# Patient Record
Sex: Male | Born: 1940 | Race: White | Hispanic: No | Marital: Married | Smoking: Former smoker
Health system: Southern US, Community
[De-identification: ages and names within clinical notes are randomized; demographics above are authoritative.]

## PROBLEM LIST (undated history)

## (undated) DIAGNOSIS — C44300 Unspecified malignant neoplasm of skin of unspecified part of face: Secondary | ICD-10-CM

## (undated) DIAGNOSIS — N390 Urinary tract infection, site not specified: Secondary | ICD-10-CM

## (undated) DIAGNOSIS — K219 Gastro-esophageal reflux disease without esophagitis: Secondary | ICD-10-CM

## (undated) DIAGNOSIS — F419 Anxiety disorder, unspecified: Secondary | ICD-10-CM

## (undated) DIAGNOSIS — E785 Hyperlipidemia, unspecified: Secondary | ICD-10-CM

## (undated) DIAGNOSIS — I219 Acute myocardial infarction, unspecified: Secondary | ICD-10-CM

## (undated) DIAGNOSIS — Z8489 Family history of other specified conditions: Secondary | ICD-10-CM

## (undated) DIAGNOSIS — I10 Essential (primary) hypertension: Secondary | ICD-10-CM

## (undated) DIAGNOSIS — M1990 Unspecified osteoarthritis, unspecified site: Secondary | ICD-10-CM

## (undated) DIAGNOSIS — I2510 Atherosclerotic heart disease of native coronary artery without angina pectoris: Secondary | ICD-10-CM

## (undated) HISTORY — DX: Unspecified malignant neoplasm of skin of unspecified part of face: C44.300

## (undated) HISTORY — PX: CARDIAC CATHETERIZATION: SHX172

## (undated) HISTORY — DX: Urinary tract infection, site not specified: N39.0

## (undated) HISTORY — PX: COLONOSCOPY: SHX174

## (undated) HISTORY — PX: OTHER SURGICAL HISTORY: SHX169

## (undated) HISTORY — PX: CORONARY ANGIOPLASTY: SHX604

## (undated) HISTORY — DX: Hyperlipidemia, unspecified: E78.5

---

## 2001-03-18 DIAGNOSIS — D126 Benign neoplasm of colon, unspecified: Secondary | ICD-10-CM | POA: Insufficient documentation

## 2003-12-10 ENCOUNTER — Ambulatory Visit: Payer: Self-pay | Admitting: Family Medicine

## 2004-03-05 ENCOUNTER — Ambulatory Visit: Payer: Self-pay | Admitting: Family Medicine

## 2004-03-17 ENCOUNTER — Ambulatory Visit: Payer: Self-pay | Admitting: Family Medicine

## 2004-04-28 ENCOUNTER — Ambulatory Visit: Payer: Self-pay | Admitting: Family Medicine

## 2004-06-16 ENCOUNTER — Ambulatory Visit: Payer: Self-pay | Admitting: Family Medicine

## 2004-08-22 ENCOUNTER — Ambulatory Visit: Payer: Self-pay | Admitting: Family Medicine

## 2004-10-27 ENCOUNTER — Ambulatory Visit: Payer: Self-pay | Admitting: Family Medicine

## 2005-05-06 ENCOUNTER — Ambulatory Visit: Payer: Self-pay | Admitting: Family Medicine

## 2005-05-18 ENCOUNTER — Ambulatory Visit: Payer: Self-pay | Admitting: Family Medicine

## 2006-03-07 ENCOUNTER — Inpatient Hospital Stay (HOSPITAL_COMMUNITY): Admission: EM | Admit: 2006-03-07 | Discharge: 2006-03-11 | Payer: Self-pay | Admitting: Emergency Medicine

## 2006-03-08 ENCOUNTER — Ambulatory Visit: Payer: Self-pay | Admitting: Vascular Surgery

## 2006-05-26 ENCOUNTER — Ambulatory Visit: Payer: Self-pay | Admitting: Family Medicine

## 2006-06-18 ENCOUNTER — Ambulatory Visit: Payer: Self-pay | Admitting: Internal Medicine

## 2006-06-24 ENCOUNTER — Ambulatory Visit: Payer: Self-pay | Admitting: Internal Medicine

## 2006-06-24 DIAGNOSIS — K648 Other hemorrhoids: Secondary | ICD-10-CM | POA: Insufficient documentation

## 2007-02-21 ENCOUNTER — Ambulatory Visit (HOSPITAL_COMMUNITY): Admission: RE | Admit: 2007-02-21 | Discharge: 2007-02-21 | Payer: Self-pay | Admitting: Family Medicine

## 2007-05-19 DIAGNOSIS — I2510 Atherosclerotic heart disease of native coronary artery without angina pectoris: Secondary | ICD-10-CM | POA: Insufficient documentation

## 2007-05-19 DIAGNOSIS — K5730 Diverticulosis of large intestine without perforation or abscess without bleeding: Secondary | ICD-10-CM | POA: Insufficient documentation

## 2007-05-19 DIAGNOSIS — F329 Major depressive disorder, single episode, unspecified: Secondary | ICD-10-CM

## 2007-05-19 DIAGNOSIS — E785 Hyperlipidemia, unspecified: Secondary | ICD-10-CM

## 2007-05-19 DIAGNOSIS — I219 Acute myocardial infarction, unspecified: Secondary | ICD-10-CM | POA: Insufficient documentation

## 2007-05-19 DIAGNOSIS — F3289 Other specified depressive episodes: Secondary | ICD-10-CM | POA: Insufficient documentation

## 2007-05-31 ENCOUNTER — Encounter: Payer: Self-pay | Admitting: Internal Medicine

## 2007-07-06 ENCOUNTER — Ambulatory Visit: Payer: Self-pay | Admitting: Internal Medicine

## 2007-07-06 DIAGNOSIS — L290 Pruritus ani: Secondary | ICD-10-CM | POA: Insufficient documentation

## 2007-07-06 DIAGNOSIS — R195 Other fecal abnormalities: Secondary | ICD-10-CM | POA: Insufficient documentation

## 2007-07-06 LAB — CONVERTED CEMR LAB
Basophils Relative: 1.3 % — ABNORMAL HIGH (ref 0.0–1.0)
Eosinophils Relative: 1.8 % (ref 0.0–5.0)
HCT: 40.8 % (ref 39.0–52.0)
MCV: 91.9 fL (ref 78.0–100.0)
Monocytes Relative: 11.3 % (ref 3.0–12.0)

## 2010-05-20 NOTE — Assessment & Plan Note (Signed)
[facility]                         GASTROENTEROLOGY OFFICE NOTE   NAME:Bradley Kaufman, Bradley Kaufman                        MRN:          [id number]  DATE:06/18/2006                            DOB:          1940-05-03    REFERRING PHYSICIAN:  Delaney Meigs, M.D.   REASON FOR CONSULTATION:  History of polyps.   ASSESSMENT:  A 70 year old white man who had a 3 mm polyp removed but  not recovered from colonoscopy 5 years ago (Dr. Corinda Gubler).  He also has  had some rectal bleeding lately, blood on the tissue paper.  Also, had a  CYPHER stent placed into the right coronary artery by Dr. Nanetta Batty  in March of 2008.   PLAN:  It is reasonable to perform a colonoscopy for the history of  polyps, though we do not know the exact pathology, but he has also had  rectal bleeding and it has been 5 years since the last colonoscopy.  He  will not be able to come off his Plavix for a year for elective issues.  We will perform the procedure on Plavix and his aspirin.  Risks,  benefits, indications and limitations placed upon the procedure due to  Plavix use (could not perform hot snare) are reviewed with the patient.  He understands and agrees to proceed.   HISTORY:  See my medical history form.   PAST MEDICAL HISTORY:  1. Coronary artery disease with CYPHER stenting to the right coronary      artery and myocardial infarction, March 2008.  2. Dyslipidemia.  3. Former smoker.  4. Depression.  5. Colonoscopy March 18, 2001 showing a 3 mm sessile polyp that was      hot-biopsy removed, but not retrieved, and diverticulosis in the      left colon.   MEDICATIONS:  1. Lipitor 40 mg daily.  2. Zoloft 50 mg daily.  3. Plavix 75 mg daily.  4. Lopressor 1 b.i.d.  5. Fish oil daily.  6. Aspirin 325 mg daily.  7. Multivitamin daily.   DRUG ALLERGIES:  None known.   FAMILY HISTORY:  Father had heart disease.  No colon cancer.   SOCIAL HISTORY:  He is married.  He is a  Press photographer.  He is approaching retirement, he says.  One son, 1  daughter.  Occasional alcohol.  No tobacco or drugs.   REVIEW OF SYSTEMS:  See medical history form.  He has been somewhat  tired.  Had some insomnia.  All other systems are negative.   PHYSICAL EXAM:  Reveals a well-developed, well-nourished white man.  Height 5 feet 7 inches, weight 183.6 pounds, blood pressure 96/68, pulse  78.  EYES:  Anicteric.  MOUTH:  Free of lesions.  NECK:  Supple.  No thyromegaly or mass.  CHEST:  Clear.  HEART:  S1, S2.  No murmurs, rubs, or gallops.  ABDOMEN:  Soft and nontender without organomegaly or mass.  RECTAL:  Deferred.  LYMPHATICS:  No neck or supraclavicular nodes.  EXTREMITIES:  No peripheral edema.  He is alert and oriented x3.  SKIN:  He has a healing ulcer on the nose from recent excision of a  precancerous skin lesion.  He has a Band-Aid on the back from same.   I appreciate the opportunity to care for this patient.     Iva Boop, MD,FACG  Electronically Signed    CEG/MedQ  DD: 06/18/2006  DT: 06/18/2006  Job #: [id number]   cc:   Delaney Meigs, M.D.  Nanetta Batty, M.D.

## 2010-05-23 NOTE — Cardiovascular Report (Signed)
[name], [name] NO.:  [id number]   MEDICAL RECORD NO.:  [id number]          PATIENT TYPE:  INP   LOCATION:  2901                         FACILITY:  [facility]   PHYSICIAN:  Bradley Kaufman, M.D.     DATE OF BIRTH:  [birth year]   DATE OF PROCEDURE:  DATE OF DISCHARGE:                            CARDIAC CATHETERIZATION   INDICATIONS:  Bradley Kaufman is a 70 year old patient who has  previously undergone PTCA in 1991 (questionable vessel).  He had been  doing fairly well.  He has a history of tobacco use and quit smoking 1  month ago.  For the past week, he started to notice developments of left  jaw, chest discomfort to his shoulder and down his arm.  Today, at  approximately 3:30 p.m., he developed severe episode of chest pain, jaw  and arm pain.  Approximately 2 hours later, he presented to [facility] Emergency Room where ECG was compatible with inferior ST-  segment elevation myocardial infarction with 3-4 mm ST elevation.  He  was treated with IV Lopressor, heparin, morphine, IV nitroglycerin and  aspirin.  Code STEMI was called.  Pain started to improve somewhat with  lessening of ST-segment depression after the second 5 mg IV Lopressor  dose.  However, the pain recurred.  He received an additional 5 mg IV  Lopressor and Integrilin was begun prior to transfer to the cardiac  catheterization laboratory.   PROCEDURE:  He arrived in the cardiac catheterization laboratory at 1813  from the emergency room.  He was still having chest pain.  Right femoral  artery and femoral vein were punctured anteriorly and a 6-French  arterial sheath and venous sheath were inserted.  Diagnostic  catheterization was done utilizing 6-French diagnostic coronary  catheters as well as 6-French pigtail catheter which was used for  biplane selective artery as well as distal aortography.  With  demonstration of total occlusion of the RCA, plans were to proceed with  emergent  percutaneous coronary intervention.  The PTCA procedure was  somewhat difficult secondary to catheter backup and crossing the lesion.  Initially, a FR-4 guide with side holes was used but backup support was  poor.  An Asahi medium wire was advanced beyond the subtotal proximal  stenosis but did not extend around the bend.  A 2.0 x 15-mm Maverick  balloon was then inserted to aid with wire navigation.  Initial balloon  dilatation was done at approximately 1838.  The wire was never able to  go beyond the total occlusion significantly and be navigated.  Consequently, the entire system was exchanged and a hockey-stick with  side hole guide was inserted for more optimal backup support.  The Asahi  medium was still unable to cross beyond the total occlusion  significantly and this was removed and exchanged for a Luge wire.  The  Luge wire was unsuccessful.  Ultimately, a choice PT wire was inserted  and with balloon support this ultimately was able to cross completely  the total occlusion and extend down to the distal RCA.  Several  dilatations  were made at the proximal site and also at the site  proximally beyond the initial bend.  It became apparent that there was  an additional lesion of approximately 70-80% just beyond the acute  margin.  Additional dilatation was done here with this 2.0 x 15-mm  Maverick balloon.  Ultimately, a 3.0 x 80-mm drug-eluting Cypher stent  was then inserted beyond the crux and dilated to 14 atmospheres x2.  A  3.0 x 23-mm Cypher stent was inserted proximally to cover the diffuse  high-grade stenoses proximally with the same extending proximal to the  99% stenosis and beyond the site of previous total occlusion.  This was  dilated to 15 atmospheres x2.  A 3.25-mm Quantum balloon was used for  poststent dilatation at both stents with dilatation up to 3.21 mm in the  more distally placed stent and a 3.25 mm in the more proximally placed  stent.  The patient's chest  pain vanished during the procedure with re-  establishment of flow.  During the procedure, he received additional IV  fentanyl at the beginning of the procedure for his ongoing chest pain in  addition to Valium.  He had previously received 10 mg of morphine.  He  received several doses of intracoronary nitroglycerin.  He did receive  double bolus Integrilin.  ACT was documented to be therapeutic.  He also  was enrolled in the [place] study and received either IV obtained  Cangrelor or Plavix at the start of the procedure per Alla Feeling protocol  with plans for continuation of Plavix per Alla Feeling protocol orally.   The patient left the catheterization laboratory in stable condition.   HEMODYNAMIC DATA:  Initial central aortic pressure was 164/108.  However, during the procedure, the patient did have reperfusion  arrhythmia with idioventricular rhythm.  Left ventricular pressure was  131/25.  However, on pullback, there was no gradient and left  ventricular pressure was 85/20.  Central aortic pressure was 85/52.   ANGIOGRAPHIC DATA:  There was evidence for mild coronary calcification  of the LAD system.   The left main coronary artery was angiographically normal.  Bifurcated  into an LAD and left circumflex system.   The LAD proximally had 30% narrowing before several septal perforating  arteries.  The LAD in its proximal third to mid segment seemed to have  narrowing of 70-80%, between the third and fourth septal perforating  artery before diagonal vessel.  The remainder of the LAD was  angiographically normal and extended to the apex.   The circumflex vessel gave rise to a proximal marginal vessel that had  segmental 30% narrowings.  The mid AV groove circumflex had diffuse 60%  narrowings.   The right coronary artery had an upward takeoff and immediately gave  rise to a conus branch.  Beyond the conus branch, the vessel was 99% stenosed and there was TIMI I flow around the  proximal bend and the  right coronary was totally occluded 100% with TIMI zero flow.   Biplane cine left ventriculography revealed mild acute LV dysfunction  with mid to basal, moderate hypocontractility on the RAO projection and  on the LAO projection hypocontractility was severe in the distal  inferoapical to low posterolateral wall.   Distal aortography did not demonstrate any significant renal artery  stenosis or significant aortoiliac disease.   Following difficult but successful coronary intervention to the right  coronary artery, the right coronary artery proved to be a moderate-size  vessel.  The diffuse proximal 99%  stenosis proximal to the bend and then  100% occlusion beyond the bend was ultimately stented with a 3.0 x 23-mm  Cypher stent and postdilated to 3.25 mm and this was reduced from  99/100% with diffuse irregularity in between the high-grade lesions to  0%.  There was mild 30% narrowing proximal to the crux which was not  intervened upon.  There was 70% diffuse stenosis beyond the crux which  ultimately underwent PTCA/stenting with a 3.0 x 18 mm Cypher stent  postdilated to 3.21 mm.  The distal RCA was a moderate-size vessel that  gave rise to a PDA and posterolateral system.   IMPRESSION:  1. Acute ST-segment elevation inferior wall myocardial infarction      secondary to total occlusion of the right coronary artery.  2. Mild to moderate left ventricular acute dysfunction with moderate      hypocontractility in the mid to basal posterior wall and severe      hypocontractility in the inferoapical to low posterolateral wall.  3. Multivessel coronary artery disease with coronary calcification      involving the left anterior descending artery with 20-30% proximal      left anterior descending artery narrowing, 70-80% mid-left anterior      descending artery narrowing; 30% obtuse marginal  #1 stenosis of      the circumflex vessel with diffuse long mid 60%  arteriovenous      groove left circumflex stenosis; and 99% and near ostial proximal      right coronary artery stenosis followed by total occlusion beyond      the proximal bend of the right coronary artery with TIMI zero flow.  4. Successful percutaneous transluminal coronary angioplasty/stenting      of the right coronary artery with the 99% and 100% occlusions being      reduced to 0% with ultimate insertion of a 3.25 x 23-mm Cypher      stent postdilated to 3.25 mm, and percutaneous transluminal      coronary angioplasty/stenting of the mid right coronary artery      stenosis of 70-80% being reduced to 0% with a 3.0 x 18-mm Cypher      stent postdilated to 3.21 mm.  5. Chest pain onset approximately 3:30:  The patient arrival in the      emergency room approximately 5:30, the patient arrival to the      cardiac catheterization laboratory approximately 6:13 p.m. and      reperfusion time approximately 1838 giving to the emergency room     arrival to balloon time of approximately 68 minutes.           ______________________________  Bradley Kaufman, M.D.     TK/MEDQ  D:  03/07/2006  T:  03/08/2006  Job:  [id number]   cc:   Delaney Meigs, M.D.  Nanetta Batty, M.D.

## 2010-05-23 NOTE — H&P (Signed)
NAMEBERLE, FITZ NO.:  [id number]   MEDICAL RECORD NO.:  [id number]          PATIENT TYPE:  INP   LOCATION:  3703                         FACILITY:  [facility]   PHYSICIAN:  Nanetta Batty, M.D.   DATE OF BIRTH:  [birth year]   DATE OF ADMISSION:  03/07/2006  DATE OF DISCHARGE:                              HISTORY & PHYSICAL   CHIEF COMPLAINT:  Chest pain and jaw pain.   HISTORY OF PRESENT ILLNESS:  Mr. Baack is a 70 year old male followed  by Dr. Allyson Sabal and Dr. Joette Catching.  He has a history of coronary  disease.  He had a remote RCA intervention in the early 1990s.  He is  admitted tonight to the emergency room with substernal chest pain and  jaw pain which started at about 3:30 p.m..  In the emergency room he had  inferior ST elevation, which improved after Lopressor, aspirin.  Integrilin and nitroglycerin.  He was taken urgently to the  catheterization lab.  He does admit to symptoms off and on for about a  week now.  Symptoms suddenly became worse today.   PAST MEDICAL HISTORY:  Remarkable for dyslipidemia.  He is on Lipitor.  He has had a prior ankle surgery.  He had a Cardiolite study that was  low risk in 2006.   CURRENT MEDICATIONS:  1. Lipitor 40 mg a day.  2. Aspirin daily.  3. Zoloft.   He has no known drug allergies.   SOCIAL HISTORY:  He quit smoking 1 month ago.  He is married.  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   FAMILY HISTORY:  Remarkable that his father had bypass in his 64s.  His  mother died at 47.   REVIEW OF SYSTEMS:  Essentially remarkable except for noted above.  He  denies any GI bleeding or melena.   PHYSICAL EXAMINATION:  VITAL SIGNS:  Blood pressure 112/82, pulse 97,  respirations 16.  GENERAL:  He is a well-developed, well-nourished male complaining of  chest pain and jaw pain.  HEENT:  Normocephalic, atraumatic.  Extraocular movements are intact.  Sclerae are nonicteric.  NECK:  Without JVD or  bruit.  CHEST: Clear to auscultation and percussion.  CARDIAC:  Regular rate and rhythm without obvious murmur, rub or gallop.  ABDOMEN:  Nontender, not distended.  EXTREMITIES:  Without edema.  NEUROLOGIC:  Grossly intact.  He is awake, alert and oriented, and  cooperative.   LABORATORY DATA:  Sodium 134, potassium 5.5, BUN 18, creatinine 1.0.  White count 12.7, hemoglobin 16, hematocrit 47, platelets 299.  CK is  150, MB 1.9, troponin 0.09.  EKG reveals normal sinus rhythm with 2-3 mm  inferior ST elevation.   IMPRESSION:  1. Acute diaphragmatic myocardial infarction.  2. Known coronary disease with remote right coronary artery      percutaneous coronary intervention.  3. Treated dyslipidemia.  4. History of smoking.   PLAN:  The patient is taken urgently the catheterization lab by Dr.  Tresa Endo.      Abelino Derrick, P.A.  Nanetta Batty, M.D.  Electronically Signed    LKK/MEDQ  D:  03/09/2006  T:  03/09/2006  Job:  [id number]

## 2010-05-23 NOTE — Discharge Summary (Signed)
Bradley Kaufman, Bradley Kaufman NO.:  [id number]   MEDICAL RECORD NO.:  [id number]          PATIENT TYPE:  INP   LOCATION:  3703                         FACILITY:  [facility]   PHYSICIAN:  Bradley Kaufman, M.D.   DATE OF BIRTH:  08/24/1940   DATE OF ADMISSION:  03/07/2006  DATE OF DISCHARGE:  03/11/2006                               DISCHARGE SUMMARY   DISCHARGE DIAGNOSES:  1. DMI treated with urgent RCA cipher stenting this admission.  2. Prior RCA intervention in 1993.  3. Residual LAD and circumflex disease, to be treated medically for      now.  4. Treated dyslipidemia.  5. Exsmoker.   HOSPITAL COURSE:  Bradley Kaufman is a pleasant 70 year old male followed by  Bradley Kaufman with a history of remote coronary disease.  He has a RCA PCI  in 1993.  He had done well since then.  Quit smoking a couple month ago.  He presented to the emergency room March 07, 2006 with chest pain and jaw  pain.  He had inferior ST elevation.  He was taken urgently to the cath  lab.  Cath revealed the total RCA was dilated and treated with 2 cipher  stents.  He also had residual disease in the LAD of 70% and circumflex  of 60%.  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  The next day this had thrombosed.  We feel he can be discharged  March 11, 2006.  We tried to add an ACE inhibitor but he was somewhat  hypotensive so we backed off on this.   DISCHARGE MEDICATIONS:  1. Metoprolol 50 mg b.i.d.  2. Coated aspirin once a day.  3. Lipitor 40 mg a day.  4. Zoloft 50 mg a day.  5. Nitroglycerin sublingual p.r.n.   LABS:  TSH is 3.45.  Urinalysis unremarkable.  Sodium 138.  Potassium  4.2.  BUN 9.  Creatinine 0.9.  White count 10.5.  Hemoglobin 13.1.  hematocrit 38.  Platelets 226.  INR on admission was 0.9.  AST was 40.  ALT 46.  CK is peaked at 263 with 27 MBs.  Lipid profile shows a  cholesterol of 119.  HDL 30.  LDL 55.  PSA was normal.  Chest x-ray  showed no significant  findings.  EKG shows sinus rhythm.  Inferior Qs  and 3 AVF.   DISPOSITION:  The patient is discharged in stable condition and we will  follow up with Bradley Kaufman.  He will need an outpatient Myoview at some  point.  He has been instructed not to work for 6 weeks, he is a Electronics engineer.  He may be allowed to go into the office.      Bradley Kaufman, P.A.      Bradley Kaufman, M.D.  Electronically Signed    LKK/MEDQ  D:  03/11/2006  T:  03/11/2006  Job:  [id number]   cc:   Bradley Kaufman, M.D.  Bradley Kaufman, M.D.

## 2011-12-24 ENCOUNTER — Other Ambulatory Visit (HOSPITAL_COMMUNITY): Payer: Self-pay | Admitting: Orthopedic Surgery

## 2011-12-24 ENCOUNTER — Ambulatory Visit (HOSPITAL_COMMUNITY)
Admission: RE | Admit: 2011-12-24 | Discharge: 2011-12-24 | Disposition: A | Discharge status: Home / Self Care | Payer: Medicare Other | Source: Ambulatory Visit | Attending: Orthopedic Surgery | Admitting: Orthopedic Surgery

## 2011-12-24 DIAGNOSIS — M25562 Pain in left knee: Secondary | ICD-10-CM

## 2011-12-24 DIAGNOSIS — Z1389 Encounter for screening for other disorder: Secondary | ICD-10-CM | POA: Insufficient documentation

## 2011-12-24 DIAGNOSIS — M25569 Pain in unspecified knee: Secondary | ICD-10-CM | POA: Insufficient documentation

## 2012-02-08 ENCOUNTER — Other Ambulatory Visit: Payer: Self-pay | Admitting: Orthopedic Surgery

## 2012-02-09 ENCOUNTER — Encounter (HOSPITAL_COMMUNITY): Payer: Self-pay | Admitting: Pharmacy Technician

## 2012-02-10 NOTE — Patient Instructions (Signed)
Bradley Kaufman  02/10/2012   Your procedure is scheduled on:  02/18/12   Report to [facility] at   1000 AM.  Call this number if you have problems the morning of surgery: [phone]   Remember:   Do not eat food or drink liquids after midnight.   Take these medicines the morning of surgery with A SIP OF WATER:    Do not wear jewelry,   Do not wear lotions, powders, or perfumes. .   Men may shave face and neck.  Do not bring valuables to the hospital.  Contacts, dentures or bridgework may not be worn into surgery.      Patients discharged the day of surgery will not be allowed to drive  home.  Name and phone number of your driver:    SEE CHG INSTRUCTION SHEET    Please read over the following fact sheets that you were given: MRSA Information, coughing and deep breathing exercises, leg exercises.                Failure to comply with these instructions may result in cancellation of your surgery.                Patient Signature ____________________________              Nurse Signature _____________________________

## 2012-02-11 ENCOUNTER — Ambulatory Visit (HOSPITAL_COMMUNITY)
Admission: RE | Admit: 2012-02-11 | Discharge: 2012-02-11 | Disposition: A | Discharge status: Home / Self Care | Payer: Medicare Other | Source: Ambulatory Visit | Attending: Orthopedic Surgery | Admitting: Orthopedic Surgery

## 2012-02-11 ENCOUNTER — Encounter (HOSPITAL_COMMUNITY): Payer: Self-pay

## 2012-02-11 ENCOUNTER — Encounter (HOSPITAL_COMMUNITY)
Admission: RE | Admit: 2012-02-11 | Discharge: 2012-02-11 | Disposition: A | Discharge status: Home / Self Care | Payer: Medicare Other | Source: Ambulatory Visit | Attending: Orthopedic Surgery | Admitting: Orthopedic Surgery

## 2012-02-11 DIAGNOSIS — Z01818 Encounter for other preprocedural examination: Secondary | ICD-10-CM | POA: Insufficient documentation

## 2012-02-11 DIAGNOSIS — Z01812 Encounter for preprocedural laboratory examination: Secondary | ICD-10-CM | POA: Insufficient documentation

## 2012-02-11 DIAGNOSIS — I10 Essential (primary) hypertension: Secondary | ICD-10-CM | POA: Insufficient documentation

## 2012-02-11 HISTORY — DX: Essential (primary) hypertension: I10

## 2012-02-11 HISTORY — DX: Atherosclerotic heart disease of native coronary artery without angina pectoris: I25.10

## 2012-02-11 HISTORY — DX: Family history of other specified conditions: Z84.89

## 2012-02-11 HISTORY — DX: Unspecified osteoarthritis, unspecified site: M19.90

## 2012-02-11 HISTORY — DX: Gastro-esophageal reflux disease without esophagitis: K21.9

## 2012-02-11 HISTORY — DX: Acute myocardial infarction, unspecified: I21.9

## 2012-02-11 HISTORY — DX: Anxiety disorder, unspecified: F41.9

## 2012-02-11 LAB — CBC
HCT: 41.1 % (ref 39.0–52.0)
MCH: 29.5 pg (ref 26.0–34.0)
MCV: 87.3 fL (ref 78.0–100.0)
Platelets: 199 10*3/uL (ref 150–400)
RBC: 4.71 MIL/uL (ref 4.22–5.81)

## 2012-02-11 LAB — BASIC METABOLIC PANEL: GFR calc Af Amer: >90 mL/min (ref >90)

## 2012-02-11 NOTE — Progress Notes (Signed)
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

## 2012-02-11 NOTE — Progress Notes (Signed)
06/04/2006- ECHO on chart  Stress Test 10/11 on chart  11/26/11 Last office visit with Dr Erlene Quan on chart  11/26/11 EKG on chart

## 2012-02-18 ENCOUNTER — Encounter (HOSPITAL_COMMUNITY): Payer: Self-pay | Admitting: Certified Registered"

## 2012-02-18 ENCOUNTER — Ambulatory Visit (HOSPITAL_COMMUNITY): Payer: Medicare Other | Admitting: Certified Registered"

## 2012-02-18 ENCOUNTER — Encounter (HOSPITAL_COMMUNITY)
Admission: RE | Disposition: A | Discharge status: Home / Self Care | Payer: Self-pay | Source: Ambulatory Visit | Attending: Orthopedic Surgery

## 2012-02-18 ENCOUNTER — Ambulatory Visit (HOSPITAL_COMMUNITY)
Admission: RE | Admit: 2012-02-18 | Discharge: 2012-02-18 | Disposition: A | Discharge status: Home / Self Care | Payer: Medicare Other | Source: Ambulatory Visit | Attending: Orthopedic Surgery | Admitting: Orthopedic Surgery

## 2012-02-18 ENCOUNTER — Encounter (HOSPITAL_COMMUNITY): Payer: Self-pay | Admitting: *Deleted

## 2012-02-18 DIAGNOSIS — X58XXXA Exposure to other specified factors, initial encounter: Secondary | ICD-10-CM | POA: Insufficient documentation

## 2012-02-18 DIAGNOSIS — I2510 Atherosclerotic heart disease of native coronary artery without angina pectoris: Secondary | ICD-10-CM | POA: Insufficient documentation

## 2012-02-18 DIAGNOSIS — I10 Essential (primary) hypertension: Secondary | ICD-10-CM | POA: Insufficient documentation

## 2012-02-18 DIAGNOSIS — K219 Gastro-esophageal reflux disease without esophagitis: Secondary | ICD-10-CM | POA: Insufficient documentation

## 2012-02-18 DIAGNOSIS — IMO0002 Reserved for concepts with insufficient information to code with codable children: Secondary | ICD-10-CM | POA: Insufficient documentation

## 2012-02-18 DIAGNOSIS — M1710 Unilateral primary osteoarthritis, unspecified knee: Secondary | ICD-10-CM | POA: Insufficient documentation

## 2012-02-18 DIAGNOSIS — I252 Old myocardial infarction: Secondary | ICD-10-CM | POA: Insufficient documentation

## 2012-02-18 DIAGNOSIS — Z9889 Other specified postprocedural states: Secondary | ICD-10-CM

## 2012-02-18 DIAGNOSIS — Z79899 Other long term (current) drug therapy: Secondary | ICD-10-CM | POA: Insufficient documentation

## 2012-02-18 HISTORY — PX: KNEE ARTHROSCOPY: SHX127

## 2012-02-18 SURGERY — ARTHROSCOPY, KNEE
Anesthesia: General | Site: Knee | Laterality: Left | Wound class: Clean

## 2012-02-18 MED ORDER — PROMETHAZINE HCL 25 MG/ML IJ SOLN: 6.2500 mg | INTRAMUSCULAR | Status: DC | PRN

## 2012-02-18 MED ORDER — BUPIVACAINE-EPINEPHRINE 0.5% -1:200000 IJ SOLN
INTRAMUSCULAR | Status: DC | PRN
  Administered 2012-02-18: 30 mL

## 2012-02-18 MED ORDER — PROPOFOL 10 MG/ML IV BOLUS
INTRAVENOUS | Status: DC | PRN
  Administered 2012-02-18: 130 mg via INTRAVENOUS

## 2012-02-18 MED ORDER — HYDROMORPHONE HCL 2 MG PO TABS: 2.0000 mg | ORAL_TABLET | ORAL | Status: DC | PRN

## 2012-02-18 MED ORDER — MORPHINE SULFATE 4 MG/ML IJ SOLN
INTRAMUSCULAR | Status: AC
  Filled 2012-02-18: qty 1

## 2012-02-18 MED ORDER — ACETAMINOPHEN 10 MG/ML IV SOLN: 1000.0000 mg | Freq: Once | INTRAVENOUS | Status: DC | PRN

## 2012-02-18 MED ORDER — MIDAZOLAM HCL 5 MG/5ML IJ SOLN
INTRAMUSCULAR | Status: DC | PRN
  Administered 2012-02-18: 2 mg via INTRAVENOUS

## 2012-02-18 MED ORDER — MEPERIDINE HCL 50 MG/ML IJ SOLN: 6.2500 mg | INTRAMUSCULAR | Status: DC | PRN

## 2012-02-18 MED ORDER — LIDOCAINE HCL (CARDIAC) 20 MG/ML IV SOLN
INTRAVENOUS | Status: DC | PRN
  Administered 2012-02-18: 20 mg via INTRAVENOUS

## 2012-02-18 MED ORDER — LACTATED RINGERS IV SOLN
INTRAVENOUS | Status: DC | PRN
  Administered 2012-02-18: 12:00:00 via INTRAVENOUS

## 2012-02-18 MED ORDER — ONDANSETRON HCL 4 MG/2ML IJ SOLN
INTRAMUSCULAR | Status: DC | PRN
  Administered 2012-02-18: 4 mg via INTRAVENOUS

## 2012-02-18 MED ORDER — ACETAMINOPHEN 10 MG/ML IV SOLN
INTRAVENOUS | Status: DC | PRN
  Administered 2012-02-18: 1000 mg via INTRAVENOUS

## 2012-02-18 MED ORDER — OXYCODONE HCL 5 MG/5ML PO SOLN
5.0000 mg | Freq: Once | ORAL | Status: AC | PRN
  Filled 2012-02-18: qty 5

## 2012-02-18 MED ORDER — LACTATED RINGERS IR SOLN
Status: DC | PRN
  Administered 2012-02-18 (×4): 3000 mL

## 2012-02-18 MED ORDER — BUPIVACAINE-EPINEPHRINE (PF) 0.5% -1:200000 IJ SOLN
INTRAMUSCULAR | Status: AC
  Filled 2012-02-18: qty 10

## 2012-02-18 MED ORDER — KETOROLAC TROMETHAMINE 30 MG/ML IJ SOLN
INTRAMUSCULAR | Status: DC | PRN
  Administered 2012-02-18: 30 mg via INTRAVENOUS

## 2012-02-18 MED ORDER — EPINEPHRINE HCL 1 MG/ML IJ SOLN
INTRAMUSCULAR | Status: AC
  Filled 2012-02-18: qty 1

## 2012-02-18 MED ORDER — HYDROMORPHONE HCL PF 1 MG/ML IJ SOLN
0.2500 mg | INTRAMUSCULAR | Status: DC | PRN
  Administered 2012-02-18 (×2): 0.5 mg via INTRAVENOUS

## 2012-02-18 MED ORDER — MORPHINE SULFATE 4 MG/ML IJ SOLN
INTRAMUSCULAR | Status: DC | PRN
  Administered 2012-02-18: 4 mg via INTRAVENOUS

## 2012-02-18 MED ORDER — POVIDONE-IODINE 7.5 % EX SOLN: Freq: Once | CUTANEOUS | Status: DC

## 2012-02-18 MED ORDER — ACETAMINOPHEN 10 MG/ML IV SOLN
INTRAVENOUS | Status: AC
  Filled 2012-02-18: qty 100

## 2012-02-18 MED ORDER — HYDROMORPHONE HCL PF 1 MG/ML IJ SOLN
INTRAMUSCULAR | Status: AC
  Filled 2012-02-18: qty 1

## 2012-02-18 MED ORDER — EPINEPHRINE HCL 1 MG/ML IJ SOLN
INTRAMUSCULAR | Status: DC | PRN
  Administered 2012-02-18 (×2): 1 mg

## 2012-02-18 MED ORDER — BUPIVACAINE-EPINEPHRINE 0.5% -1:200000 IJ SOLN
INTRAMUSCULAR | Status: AC
  Filled 2012-02-18: qty 1

## 2012-02-18 MED ORDER — FENTANYL CITRATE 0.05 MG/ML IJ SOLN
INTRAMUSCULAR | Status: DC | PRN
  Administered 2012-02-18 (×2): 50 ug via INTRAVENOUS

## 2012-02-18 MED ORDER — OXYCODONE HCL 5 MG PO TABS
5.0000 mg | ORAL_TABLET | Freq: Once | ORAL | Status: AC | PRN
  Administered 2012-02-18: 5 mg via ORAL
  Filled 2012-02-18: qty 1

## 2012-02-18 SURGICAL SUPPLY — 28 items
BANDAGE ELASTIC 4 VELCRO ST LF (GAUZE/BANDAGES/DRESSINGS) ×2 IMPLANT
BANDAGE GAUZE ELAST BULKY 4 IN (GAUZE/BANDAGES/DRESSINGS) ×2 IMPLANT
BLADE 4.2CUDA (BLADE) IMPLANT
BLADE CUDA SHAVER 3.5 (BLADE) ×2 IMPLANT
CLOTH BEACON ORANGE TIMEOUT ST (SAFETY) ×2 IMPLANT
COUNTER NEEDLE 20 DBL MAG RED (NEEDLE) ×2 IMPLANT
CUFF TOURN SGL QUICK 34 (TOURNIQUET CUFF) ×1
CUFF TRNQT CYL 34X4X40X1 (TOURNIQUET CUFF) ×1 IMPLANT
DRSG EMULSION OIL 3X3 NADH (GAUZE/BANDAGES/DRESSINGS) ×2 IMPLANT
DRSG PAD ABDOMINAL 8X10 ST (GAUZE/BANDAGES/DRESSINGS) ×2 IMPLANT
DURAPREP 26ML APPLICATOR (WOUND CARE) ×2 IMPLANT
ELECT REM PT RETURN 9FT ADLT (ELECTROSURGICAL) ×2
ELECTRODE REM PT RTRN 9FT ADLT (ELECTROSURGICAL) ×1 IMPLANT
GLOVE BIO SURGEON STRL SZ7.5 (GLOVE) ×2 IMPLANT
GLOVE BIO SURGEON STRL SZ8 (GLOVE) ×4 IMPLANT
GLOVE ECLIPSE 8.0 STRL XLNG CF (GLOVE) ×6 IMPLANT
GLOVE INDICATOR 8.0 STRL GRN (GLOVE) ×4 IMPLANT
MANIFOLD NEPTUNE II (INSTRUMENTS) ×4 IMPLANT
PACK ARTHROSCOPY WL (CUSTOM PROCEDURE TRAY) ×2 IMPLANT
PAD CAST 4YDX4 CTTN HI CHSV (CAST SUPPLIES) ×1 IMPLANT
PADDING CAST COTTON 4X4 STRL (CAST SUPPLIES) ×1
POSITIONER SURGICAL ARM (MISCELLANEOUS) ×2 IMPLANT
SET ARTHROSCOPY TUBING (MISCELLANEOUS) ×1
SET ARTHROSCOPY TUBING LN (MISCELLANEOUS) ×1 IMPLANT
SUT ETHILON 4 0 PS 2 18 (SUTURE) ×2 IMPLANT
SYR 20CC LL (SYRINGE) ×2 IMPLANT
WAND 90 DEG TURBOVAC W/CORD (SURGICAL WAND) ×2 IMPLANT
WRAP KNEE MAXI GEL POST OP (GAUZE/BANDAGES/DRESSINGS) ×2 IMPLANT

## 2012-02-18 NOTE — Anesthesia Postprocedure Evaluation (Signed)
Anesthesia Post Note  Patient: Bradley Kaufman  Procedure(s) Performed: Procedure(s) (LRB): ARTHROSCOPY KNEE (Left)  Anesthesia type: General  Patient location: PACU  Post pain: Pain level controlled  Post assessment: Post-op Vital signs reviewed  Last Vitals: BP 110/73  Pulse 68  Temp(Src) 36.4 C  Resp 14  SpO2 97%  Post vital signs: Reviewed  Level of consciousness: sedated  Complications: No apparent anesthesia complications

## 2012-02-18 NOTE — Brief Op Note (Signed)
02/18/2012  2:00 PM  PATIENT:  Nathanial Rancher  72 y.o. male  PRE-OPERATIVE DIAGNOSIS:  left knee medial and lateral tear  POST-OPERATIVE DIAGNOSIS:  Left knee medial and lateral tear  PROCEDURE:  Procedure(s): ARTHROSCOPY KNEE (Left)with partial medial meniscectomy and shaving medial femoral condyle  SURGEON:  Surgeon(s) and Role:    * Drucilla Schmidt, MD - Primary  PHYSICIAN ASSISTANT:   ASSISTANTS:nurse  ANESTHESIA:   general  EBL:  Total I/O In: 450 [I.V.:450] Out: -   BLOOD ADMINISTERED:none  DRAINS: none   LOCAL MEDICATIONS USED:  MARCAINE     SPECIMEN:  No Specimen  DISPOSITION OF SPECIMEN:  N/A  COUNTS:  YES  TOURNIQUET:   Total Tourniquet Time Documented: Thigh (Left) - 49 minutes Total: Thigh (Left) - 49 minutes   DICTATION: .Other Dictation: Dictation Number [id number]  PLAN OF CARE: Discharge to home after PACU  PATIENT DISPOSITION:  PACU - hemodynamically stable.   Delay start of Pharmacological VTE agent (>24hrs) due to surgical blood loss or risk of bleeding: yes

## 2012-02-18 NOTE — Anesthesia Preprocedure Evaluation (Addendum)
Anesthesia Evaluation  Patient identified by MRN, date of birth, ID band Patient awake    Reviewed: Allergy & Precautions, H&P , NPO status , Patient's Chart, lab work & pertinent test results  Airway Mallampati: II TM Distance: >3 FB Neck ROM: Full    Dental  (+) Dental Advisory Given, Edentulous Upper, Missing and Partial Lower   Pulmonary neg pulmonary ROS,  breath sounds clear to auscultation  Pulmonary exam normal       Cardiovascular hypertension, Pt. on medications + CAD, + Past MI and + Cardiac Stents Rhythm:Regular Rate:Normal     Neuro/Psych PSYCHIATRIC DISORDERS Anxiety Depression negative neurological ROS     GI/Hepatic Neg liver ROS, GERD-  Medicated,  Endo/Other  negative endocrine ROS  Renal/GU negative Renal ROS     Musculoskeletal negative musculoskeletal ROS (+)   Abdominal   Peds  Hematology negative hematology ROS (+)   Anesthesia Other Findings   Reproductive/Obstetrics                          Anesthesia Physical Anesthesia Plan  ASA: III  Anesthesia Plan: General   Post-op Pain Management:    Induction: Intravenous  Airway Management Planned: LMA  Additional Equipment:   Intra-op Plan:   Post-operative Plan: Extubation in OR  Informed Consent: I have reviewed the patients History and Physical, chart, labs and discussed the procedure including the risks, benefits and alternatives for the proposed anesthesia with the patient or authorized representative who has indicated his/her understanding and acceptance.   Dental advisory given  Plan Discussed with: CRNA  Anesthesia Plan Comments:         Anesthesia Quick Evaluation

## 2012-02-18 NOTE — Progress Notes (Signed)
Patient has been taking Amoxicillin for sinus congestion.

## 2012-02-18 NOTE — Op Note (Signed)
[name], Bradley Kaufman NO.:  [id number]  MEDICAL RECORD NO.:  [id number]  LOCATION:  [facility]                         FACILITY:  [facility]  PHYSICIAN:  Marlowe Kays, M.D.  DATE OF BIRTH:  March 06, 1940  DATE OF PROCEDURE:  02/18/2012 DATE OF DISCHARGE:  02/18/2012                              OPERATIVE REPORT   PREOPERATIVE DIAGNOSIS:  Complex tear medial meniscus, left knee.  POSTOPERATIVE DIAGNOSIS: 1. Complex tear medial meniscus. 2. Osteoarthritis, medial femoral condyle left knee.  OPERATION: 1. Left knee arthroscopy with one partial medial meniscectomy. 2. Shaving of medial femoral condyle.  SURGEON:  Marlowe Kays, M.D.  ASSISTANT:  Nurse.  ANESTHESIA:  General.  PATHOLOGY AND JUSTIFICATION FOR PROCEDURE:  Painful knee with an MRI demonstrating the preoperative diagnoses.  He is currently on Plavix and was cleared for surgery by his cardiologist.  PROCEDURE IN DETAIL:  Satisfactory general anesthesia, Ace wrap, and knee support to right lower extremity, pneumatic tourniquet applied to left lower extremity, tourniquet inflated to 300 mmHg.  After Esmarching out the leg non-sterilely.  Thigh stabilizer applied.  The left leg was then prepped with DuraPrep from stabilizer to ankle and draped in sterile field.  Time-out was performed.  Superior medial saline inflow. First, an anterolateral portal, medial compartment knee joint was evaluated.  He had some roughening of the anterior third of the medial meniscus with some synovitis there.  I resected the synovitis and shaved the medial meniscus with a 3.5 shaver.  I probed the interval between the anterior meniscus and a synovial lining and did not find any separation.  Looking posteriorly, he had complex tear of the medial meniscus as described on the MRI at the posterior curve.  He had a flap, which had flipped back on itself, so it was trapped between the medial meniscus and the medial femoral condyle.  I  trimmed the meniscus back to stable rim all around with combination of 3.5 shaver and small baskets. Final pictures were taken.  I also shaved the medial femoral condyle with a 3.5 shaver.  Looking at the medial gutter and suprapatellar area, he had some slight wear of the lateral facet of patella.  Looking patella on reversed the portals, it did not require any shaving.  His ACL was intact.  The lateral meniscus was intact.  He had a good bit of fraying and wear of the lateral tibial plateau, but nothing shaveable. The joint was then irrigated to clearing all fluid, possible removed.  I closed 2 entry portals with 4-0 nylon and then injected 20 mL of 0.5% Marcaine with adrenaline and 4 mg of morphine through the inflow apparatus.  He was also given 15 mg of Toradol IV.  The 3 portals were closed with 4-0 nylon.  Betadine, Adaptic, dry sterile dressing applied.  He tolerated the procedure well, and was taken to the recovery room in satisfied condition with no known complications.          ______________________________ Marlowe Kays, M.D.     JA/MEDQ  D:  02/18/2012  T:  02/18/2012  Job:  [id number]

## 2012-02-18 NOTE — H&P (Signed)
Bradley Kaufman is an 72 y.o. male.   Chief Complaint:painful let knee HPI:MRI demonstrates complex ear medial meniscus  Past Medical History  Diagnosis Date  . Coronary artery disease     stents 2007   . Myocardial infarction     1993 and 2007   . Hypertension   . Anxiety   . GERD (gastroesophageal reflux disease)   . 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 Arthritis   . Family history of anesthesia complication     mother has pseudocholinesterase     Past Surgical History  Procedure Laterality Date  . Cardiac catheterization    . Coronary angioplasty      stents 2007   . Heel and ankle surgery       right     History reviewed. No pertinent family history. Social History:  reports that he quit smoking about 6 years ago. He has never used smokeless tobacco. He reports that he drinks about 2.4 ounces of alcohol per week. He reports that he does not use illicit drugs.  Allergies: No Known Allergies  Medications Prior to Admission  Medication Sig Dispense Refill  . lisinopril (PRINIVIL,ZESTRIL) 5 MG tablet Take 5 mg by mouth daily before breakfast.      . Lysine 1000 MG TABS Take 1 tablet by mouth daily.      . nebivolol (BYSTOLIC) 10 MG tablet Take 10 mg by mouth daily before breakfast.      . omeprazole (PRILOSEC) 20 MG capsule Take 20 mg by mouth daily.      Marland Kitchen OVER THE COUNTER MEDICATION Take 1 capsule by mouth daily. Mega red      . rosuvastatin (CRESTOR) 40 MG tablet Take 40 mg by mouth every evening.      . zolpidem (AMBIEN) 10 MG tablet Take 10 mg by mouth at bedtime as needed. sleep      . aspirin 325 MG tablet Take 325 mg by mouth daily.      . clopidogrel (PLAVIX) 75 MG tablet Take 75 mg by mouth daily before breakfast.      . ibuprofen (ADVIL,MOTRIN) 200 MG tablet Take 600 mg by mouth every 6 (six) hours as needed. Pain        No results found for this or any previous visit (from the past 48 hour(s)). No results found.  ROS  Blood pressure 119/85, pulse 77,  temperature 98.4 F (36.9 C), resp. rate 20, SpO2 96.00%. Physical Exam  Constitutional: He is oriented to person, place, and time. He appears well-developed and well-nourished.  HENT:  Head: Normocephalic and atraumatic.  Right Ear: External ear normal.  Left Ear: External ear normal.  Nose: Nose normal.  Mouth/Throat: Oropharynx is clear and moist.  edentulous  Eyes: EOM are normal. Pupils are equal, round, and reactive to light.  Sclerae red--he thinks due to shampoo  Neck: Normal range of motion. Neck supple.  Cardiovascular: Normal rate, regular rhythm, normal heart sounds and intact distal pulses.   Respiratory: Effort normal and breath sounds normal.  GI: Soft. Bowel sounds are normal.  Musculoskeletal: Normal range of motion. He exhibits tenderness.  Tender medial joint line left knee  Neurological: He is alert and oriented to person, place, and time. He has normal reflexes.  Skin: Skin is warm and dry.  Psychiatric: He has a normal mood and affect. His behavior is normal. Judgment and thought content normal.     Assessment/Plan Torn medial meniscus left knee  Left knee arthroscopy with partial medial meniscectomy  APLINGTON,JAMES P 02/18/2012, 12:21 PM

## 2012-02-18 NOTE — Preoperative (Addendum)
Beta Blockers   Reason not to administer Beta Blockers:took beta blocker this am

## 2012-02-18 NOTE — Transfer of Care (Signed)
Immediate Anesthesia Transfer of Care Note  Patient: Bradley Kaufman  Procedure(s) Performed: Procedure(s): ARTHROSCOPY KNEE (Left)  Patient Location: PACU  Anesthesia Type:General  Level of Consciousness: awake, alert , oriented and patient cooperative  Airway & Oxygen Therapy: Patient Spontanous Breathing and Patient connected to face mask oxygen  Post-op Assessment: Report given to PACU RN, Post -op Vital signs reviewed and stable and Patient moving all extremities  Post vital signs: Reviewed and stable  Complications: No apparent anesthesia complications

## 2012-02-19 ENCOUNTER — Encounter (HOSPITAL_COMMUNITY): Payer: Self-pay | Admitting: Orthopedic Surgery

## 2012-02-20 ENCOUNTER — Other Ambulatory Visit: Payer: Self-pay

## 2012-04-07 ENCOUNTER — Ambulatory Visit: Payer: Medicare Other | Attending: Orthopedic Surgery | Admitting: Physical Therapy

## 2012-04-07 DIAGNOSIS — M25569 Pain in unspecified knee: Secondary | ICD-10-CM | POA: Insufficient documentation

## 2012-04-07 DIAGNOSIS — IMO0001 Reserved for inherently not codable concepts without codable children: Secondary | ICD-10-CM | POA: Insufficient documentation

## 2012-04-07 DIAGNOSIS — R5381 Other malaise: Secondary | ICD-10-CM | POA: Insufficient documentation

## 2012-04-11 ENCOUNTER — Ambulatory Visit: Payer: Medicare Other | Admitting: *Deleted

## 2012-07-25 ENCOUNTER — Other Ambulatory Visit: Payer: Self-pay | Admitting: *Deleted

## 2012-07-25 MED ORDER — ROSUVASTATIN CALCIUM 40 MG PO TABS: 40.0000 mg | ORAL_TABLET | Freq: Every evening | ORAL | Status: AC

## 2012-08-10 ENCOUNTER — Other Ambulatory Visit: Payer: Self-pay

## 2012-11-07 ENCOUNTER — Encounter: Payer: Self-pay | Admitting: Cardiovascular Disease

## 2012-11-07 ENCOUNTER — Ambulatory Visit (INDEPENDENT_AMBULATORY_CARE_PROVIDER_SITE_OTHER): Payer: Medicare Other | Admitting: Cardiovascular Disease

## 2012-11-07 VITALS — BP 92/70 | HR 79 | Ht 67.0 in | Wt 173.0 lb

## 2012-11-07 DIAGNOSIS — I10 Essential (primary) hypertension: Secondary | ICD-10-CM

## 2012-11-07 DIAGNOSIS — E785 Hyperlipidemia, unspecified: Secondary | ICD-10-CM

## 2012-11-07 DIAGNOSIS — I2510 Atherosclerotic heart disease of native coronary artery without angina pectoris: Secondary | ICD-10-CM

## 2012-11-07 DIAGNOSIS — I219 Acute myocardial infarction, unspecified: Secondary | ICD-10-CM

## 2012-11-07 NOTE — Assessment & Plan Note (Signed)
Well-controlled on current medications 

## 2012-11-07 NOTE — Assessment & Plan Note (Signed)
Status post balloon angioplasty of his RCA by myself in 1993. He suffered an inferior wall myocardial infarction 03/07/06 to PCI and stenting using 2 drug-eluting stents by Dr. Daphene Jaeger. He did have moderate disease in his LAD and circumflex as well. His last Myoview performed 10/21/09 was nonischemic. He denies chest pain or shortness of breath.

## 2012-11-07 NOTE — Progress Notes (Signed)
11/07/2012 Bradley Kaufman   1940/11/29  [id number]  Primary Physician Bradley Hector, MD Primary Cardiologist: Bradley Gess MD Bradley Kaufman   HPI:  The patient is a delightful 72 year old fit-appearing married Caucasian male, father of 2 and grandfather of 2 grandchildren, whom I last saw in the office a year ago. He has a history of CAD, status post RCA POBA by me back in 1993. 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 Daphene Kaufman. He did have moderate disease in his LAD and circumflex as well. His Myoview performed October 21, 2009, was nonischemic. He denies chest pain or shortness of breath. His other problems include hypertension and hyperlipidemia. Since I saw him a year ago he has been completely asymptomatic. Dr. Lysbeth Kaufman followed his lipid profile.    Current Outpatient Prescriptions  Medication Sig Dispense Refill  . aspirin 325 MG tablet Take 325 mg by mouth daily.      . clopidogrel (PLAVIX) 75 MG tablet Take 75 mg by mouth daily before breakfast.      . lisinopril (PRINIVIL,ZESTRIL) 5 MG tablet Take 5 mg by mouth daily before breakfast.      . Lysine 1000 MG TABS Take 1 tablet by mouth daily.      . Misc Natural Products (COSAMIN ASU ADVANCED FORMULA PO) Take 1 mg by mouth 2 (two) times daily.      . nebivolol (BYSTOLIC) 10 MG tablet Take 10 mg by mouth daily before breakfast.      . omeprazole (PRILOSEC) 20 MG capsule Take 20 mg by mouth daily.      Marland Kitchen OVER THE COUNTER MEDICATION Take 1 capsule by mouth daily. Mega red      . rosuvastatin (CRESTOR) 40 MG tablet Take 1 tablet (40 mg total) by mouth every evening.  30 tablet  4  . zolpidem (AMBIEN) 10 MG tablet Take 10 mg by mouth at bedtime as needed. sleep       No current facility-administered medications for this visit.    No Known Allergies  History   Social History  . Marital Status: Married    Spouse Name: N/A      Number of Children: N/A  . Years of Education: N/A   Occupational History  . Not on file.   Social History Main Topics  . Smoking status: Former Smoker    Quit date: 01/05/2006  . Smokeless tobacco: Never Used  . Alcohol Use: 2.4 oz/week    2 Cans of beer, 2 Shots of liquor per week  . Drug Use: No  . Sexual Activity:    Other Topics Concern  . Not on file   Social History Narrative  . No narrative on file     Review of Systems: General: negative for chills, fever, night sweats or weight changes.  Cardiovascular: negative for chest pain, dyspnea on exertion, edema, orthopnea, palpitations, paroxysmal nocturnal dyspnea or shortness of breath Dermatological: negative for rash Respiratory: negative for cough or wheezing Urologic: negative for hematuria Abdominal: negative for nausea, vomiting, diarrhea, bright red blood per rectum, melena, or hematemesis Neurologic: negative for visual changes, syncope, or dizziness All other systems reviewed and are otherwise negative except as noted above.    Blood pressure 92/70, pulse 79, height 5\' 7"  (1.702 m), weight 173 lb (78.472 kg).  General appearance: alert and no distress Neck: no adenopathy, no carotid bruit, no JVD, supple, symmetrical, trachea midline  and thyroid not enlarged, symmetric, no tenderness/mass/nodules Lungs: clear to auscultation bilaterally Heart: regular rate and rhythm, S1, S2 normal, no murmur, click, rub or gallop Extremities: extremities normal, atraumatic, no cyanosis or edema  EKG normal sinus rhythm at 79 with RSR prime in lead V1 consistent with an RV conduction delay and lower limb voltage.  ASSESSMENT AND PLAN:   CORONARY ARTERY DISEASE Status post balloon angioplasty of his RCA by myself in 1993. He suffered an inferior wall myocardial infarction 03/07/06 to PCI and stenting using 2 drug-eluting stents by Dr. Daphene Kaufman. He did have moderate disease in his LAD and circumflex as well. His last  Myoview performed 10/21/09 was nonischemic. He denies chest pain or shortness of breath.  DYSLIPIDEMIA On statin therapy followed by Dr. Lysbeth Kaufman. Apparently his total cholesterol was measured recently 130.  Essential hypertension Well-controlled on current medications      Bradley Gess MD [facility], [facility] 11/07/2012 10:01 AM

## 2012-11-07 NOTE — Assessment & Plan Note (Signed)
On statin therapy followed by Dr. Lysbeth Galas. Apparently his total cholesterol was measured recently 130.

## 2012-11-07 NOTE — Patient Instructions (Signed)
Your physician wants you to follow-up in: 1 year with Dr Berry. You will receive a reminder letter in the mail two months in advance. If you don't receive a letter, please call our office to schedule the follow-up appointment.  

## 2012-11-10 ENCOUNTER — Other Ambulatory Visit: Payer: Self-pay

## 2013-01-19 IMAGING — CR DG ORBITS FOR FOREIGN BODY
2 series · 2 of 2 positions shown · non-contrast
Comparison: None

CLINICAL DATA: Pre MRI.

ORBITS FOR FOREIGN BODY - 2 VIEW

[w waters (1 of 2)]
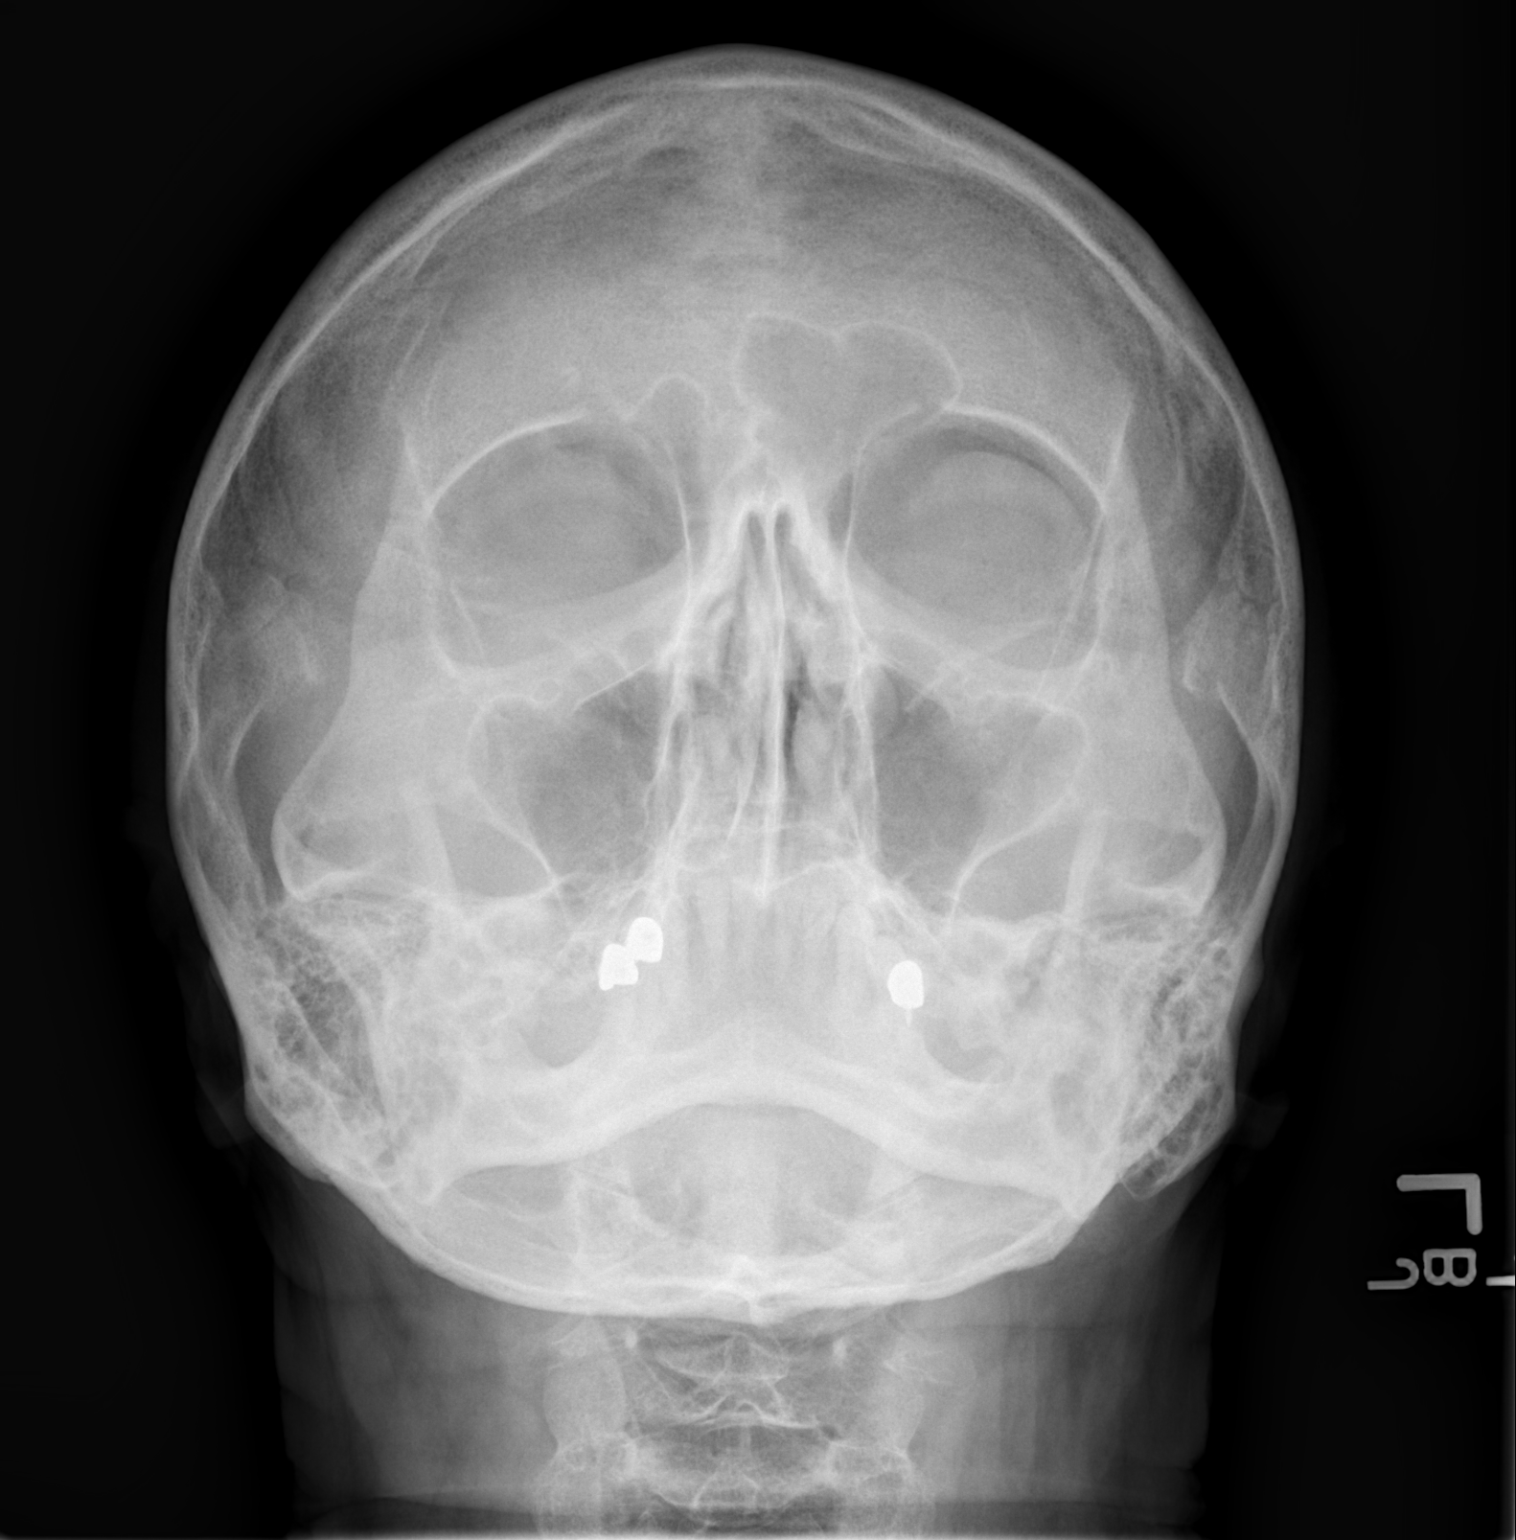

[w waters (2 of 2)]
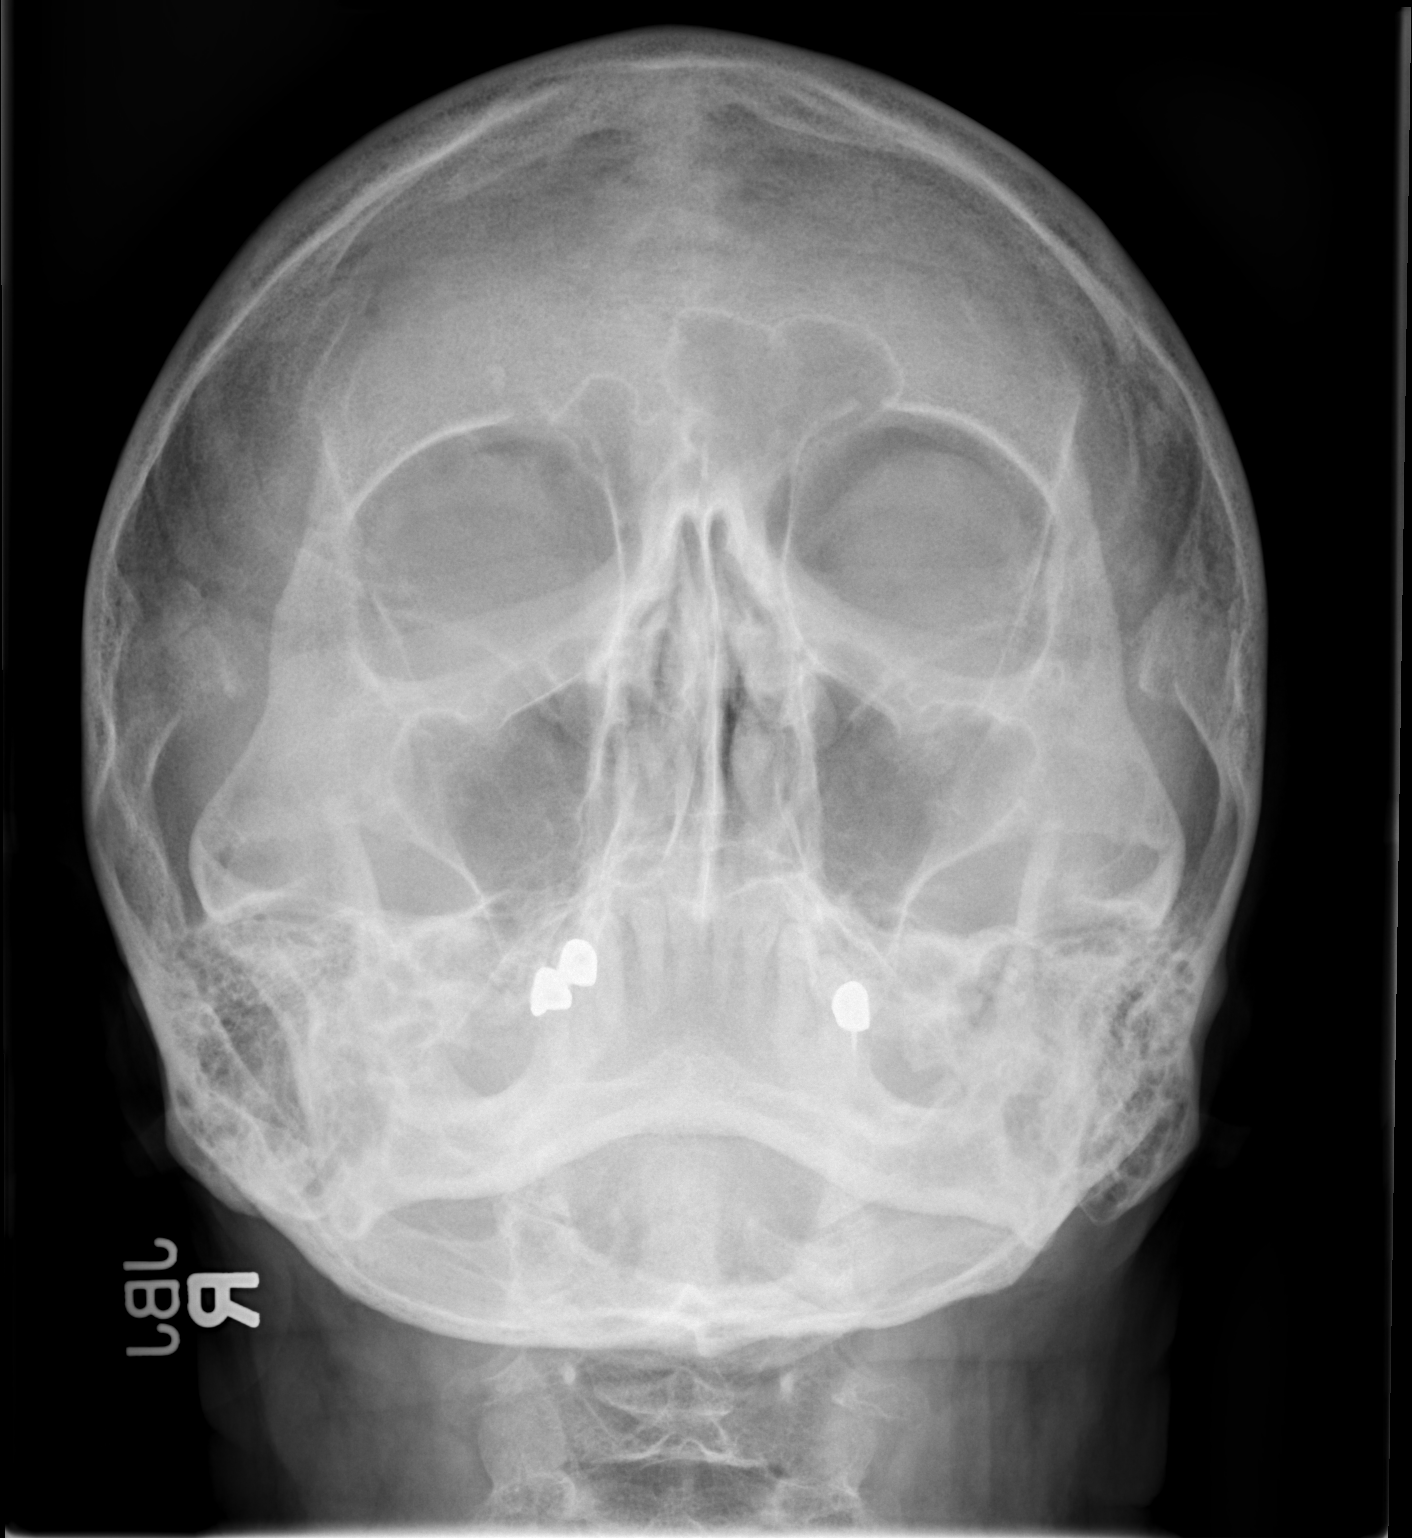

[2 of 2 positions shown; findings below may reference images not displayed]

FINDINGS: No metallic foreign bodies are identified.  The paranasal
sinuses are clear.
IMPRESSION: Negative orbits for metallic foreign body.

## 2013-02-27 ENCOUNTER — Encounter: Payer: Self-pay | Admitting: Cardiovascular Disease

## 2013-03-09 IMAGING — CR DG CHEST 2V
2 series · 2 of 2 positions shown · non-contrast
Comparison: 03/07/2006

CLINICAL DATA: Hypertension, preoperative evaluation

CHEST - 2 VIEW

[w chest pa]
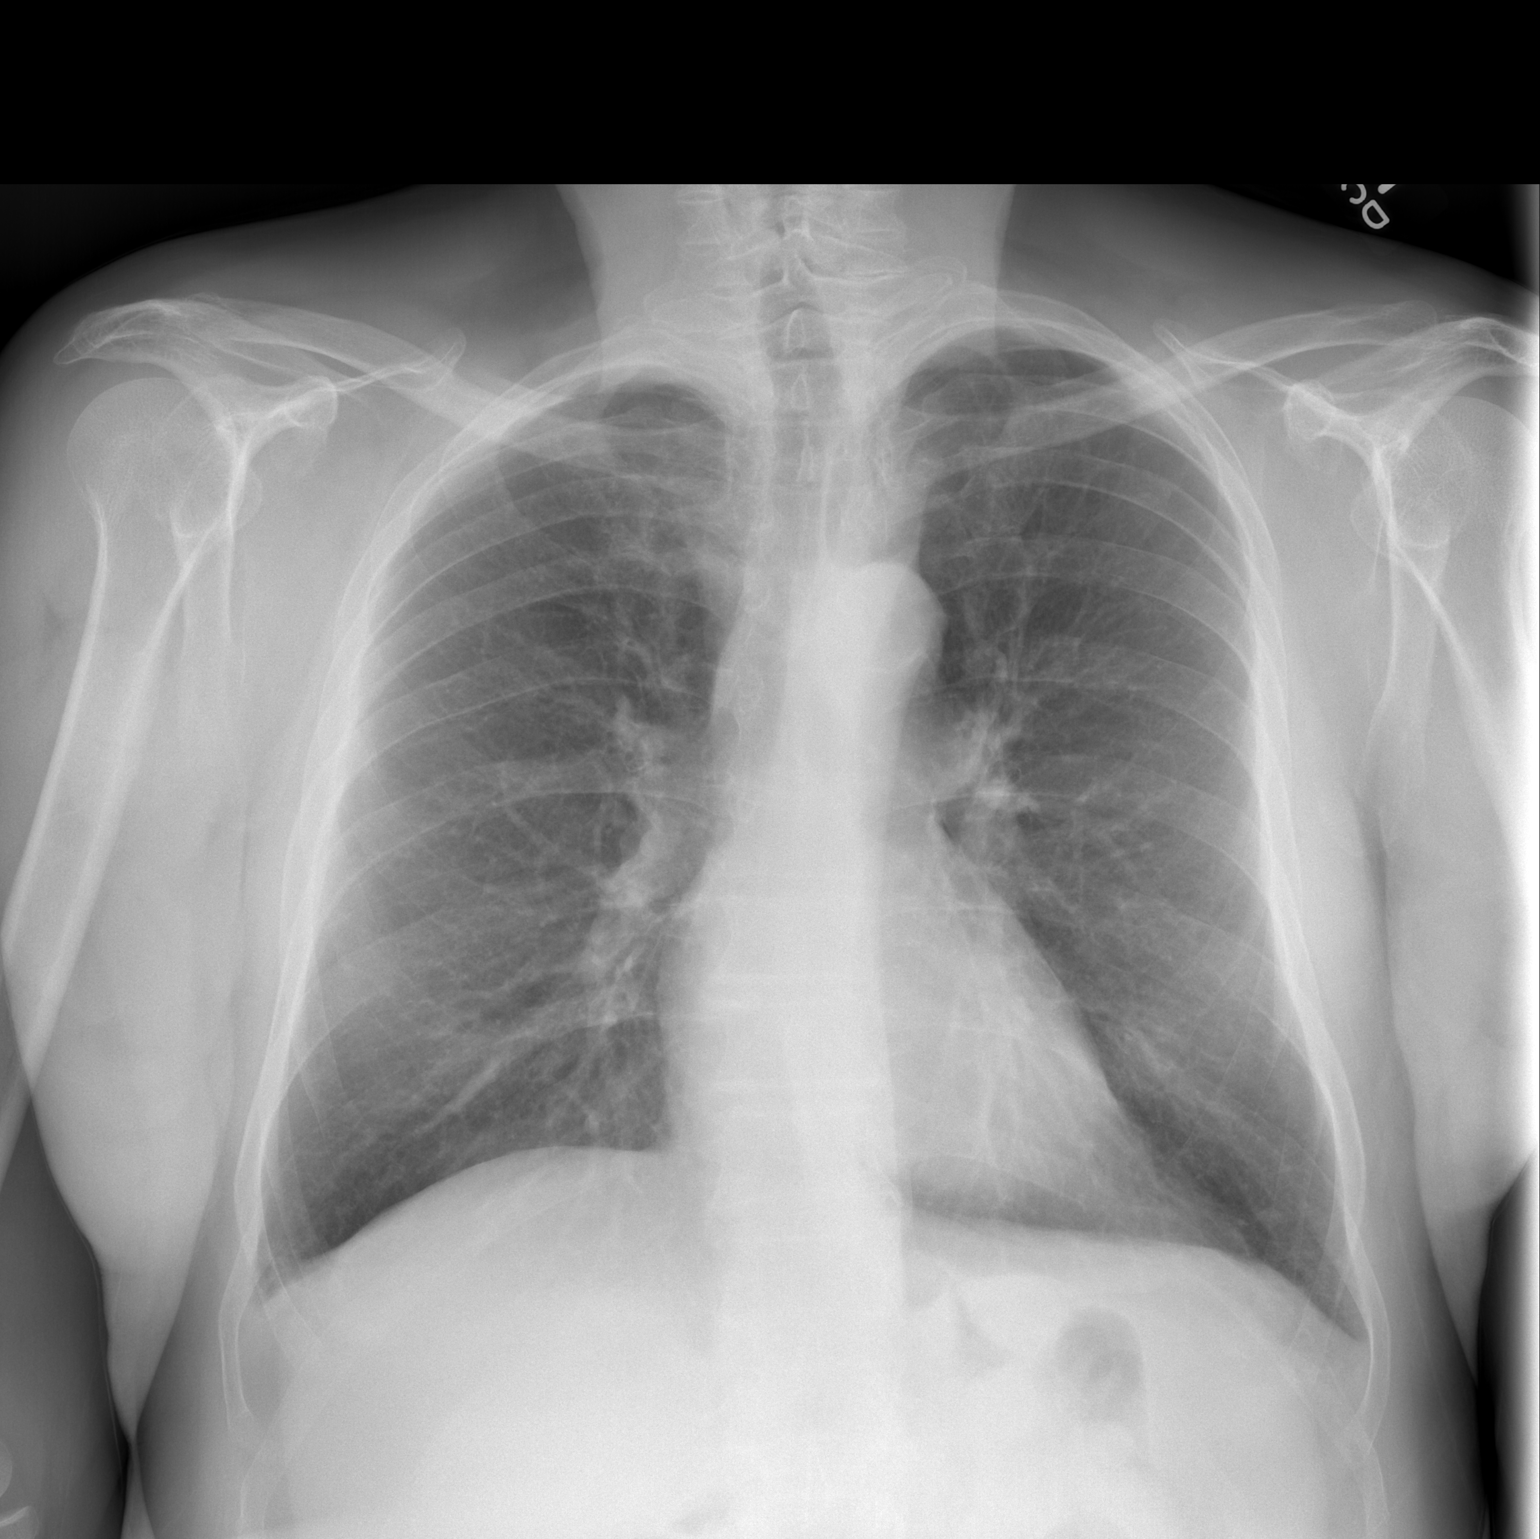

[w chest lat]
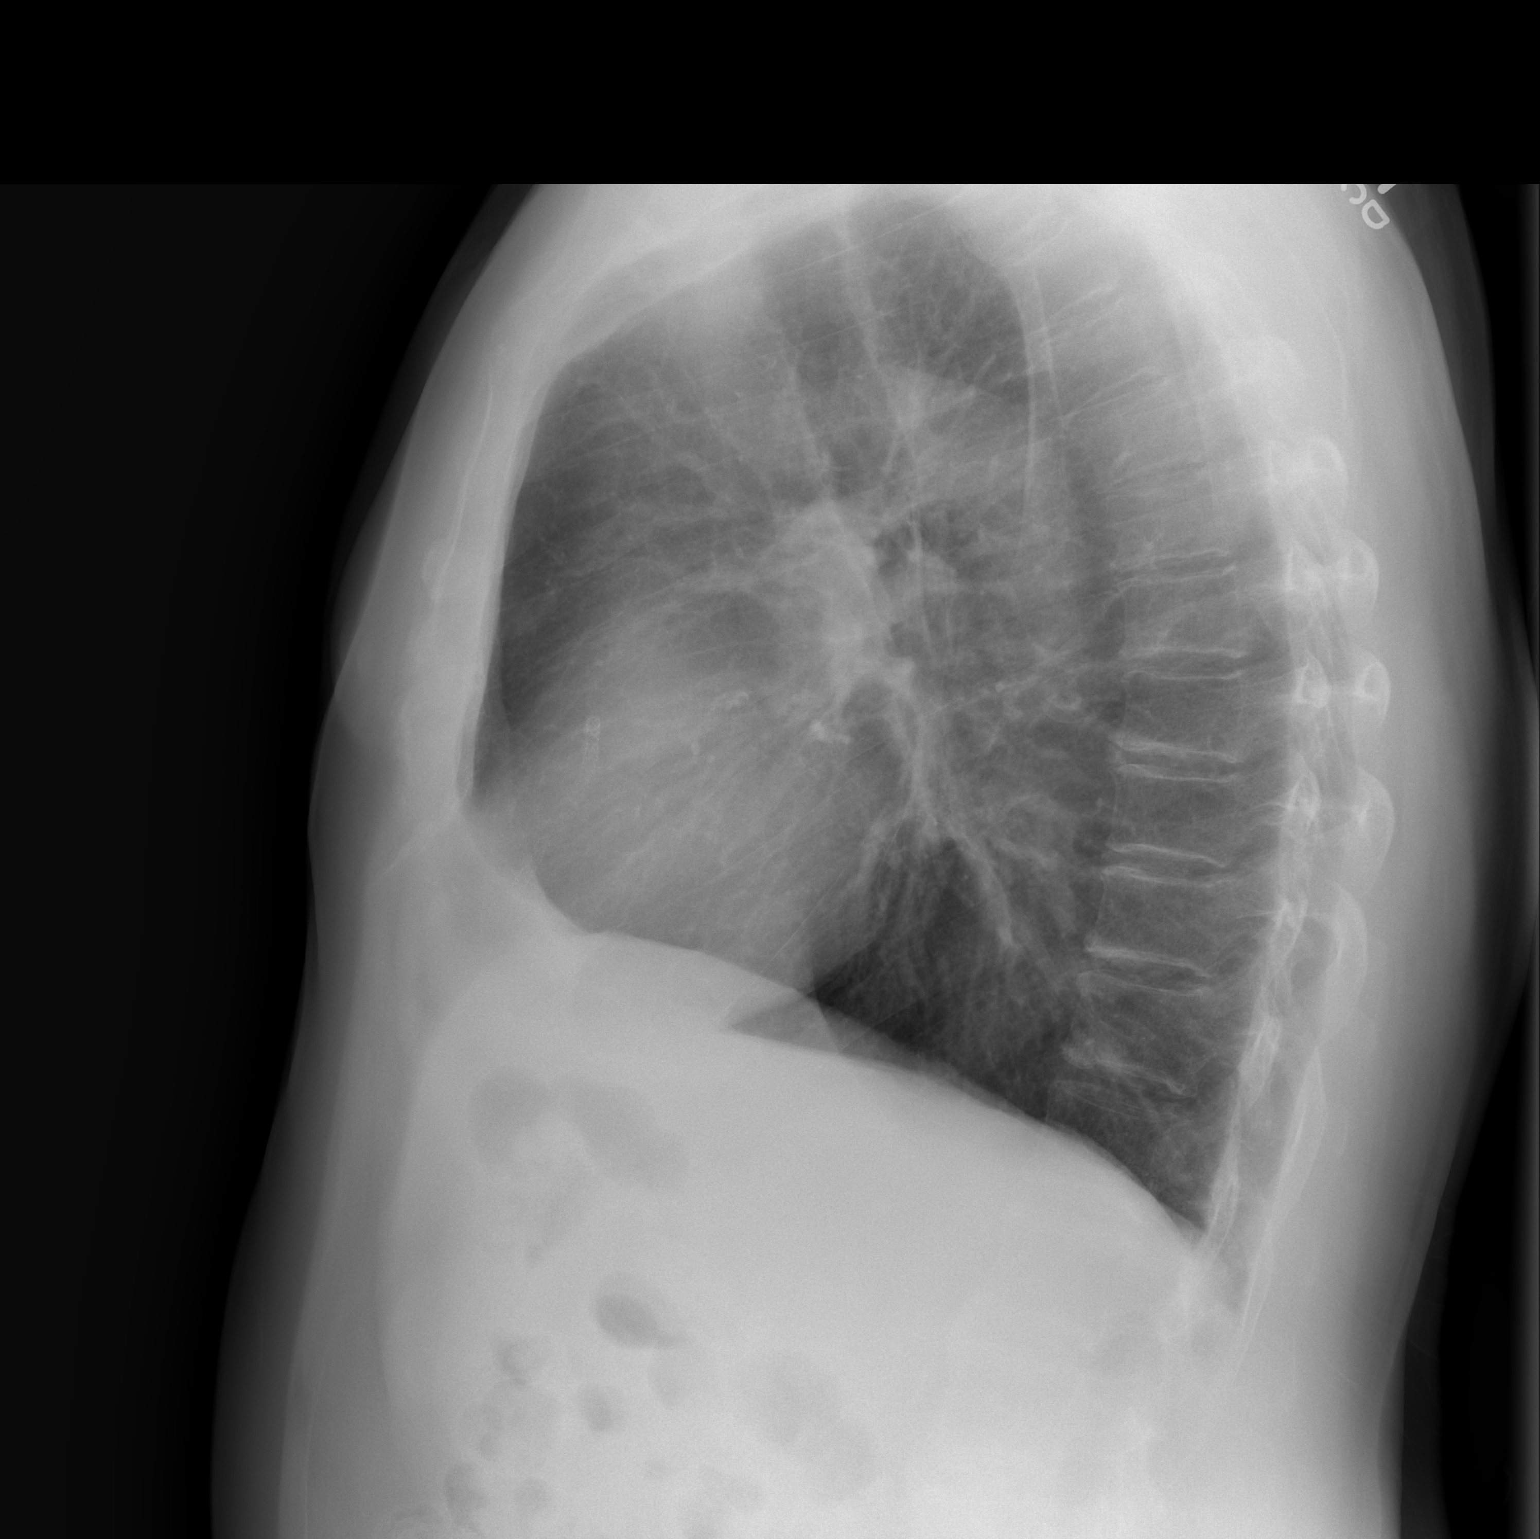

[2 of 2 positions shown; findings below may reference images not displayed]

FINDINGS: The heart and pulmonary vascularity are within normal
limits.  The lungs are clear bilaterally.  Coronary stents are
seen.
IMPRESSION: No acute abnormality is noted

## 2014-02-14 ENCOUNTER — Encounter: Payer: Self-pay | Admitting: Cardiovascular Disease

## 2014-08-24 ENCOUNTER — Encounter: Payer: Self-pay | Admitting: Cardiovascular Disease

## 2015-10-02 ENCOUNTER — Ambulatory Visit (INDEPENDENT_AMBULATORY_CARE_PROVIDER_SITE_OTHER): Payer: Medicare Other | Admitting: Cardiovascular Disease

## 2015-10-02 ENCOUNTER — Encounter: Payer: Self-pay | Admitting: Cardiovascular Disease

## 2015-10-02 VITALS — BP 90/70 | HR 74 | Ht 67.0 in | Wt 168.0 lb

## 2015-10-02 DIAGNOSIS — I10 Essential (primary) hypertension: Secondary | ICD-10-CM | POA: Diagnosis not present

## 2015-10-02 NOTE — Assessment & Plan Note (Signed)
History of CAD status post angioplasty of his RCA by myself back in 1993. He suffered an inferior wall myocardial infarction 03/07/2006 was treated with PCI and stenting using 2 drug-eluting stents by Dr. Ellouise Newer. He did have moderate disease in his LAD and circumflex as well. Myoview performed 10/21/2009 was nonischemic. He denies chest pain or shortness of breath.

## 2015-10-02 NOTE — Assessment & Plan Note (Signed)
History of dyslipidemia on statin therapy with recent lipid profile profile performed by his PCP 09/27/15 revealed total cholesterol 118, LDL 52 and HDL of 51

## 2015-10-02 NOTE — Patient Instructions (Signed)
Medication Instructions:  NO CHANGES.   Follow-Up: Your physician wants you to follow-up in: 12 MONTHS WITH DR BERRY.  You will receive a reminder letter in the mail two months in advance. If you don't receive a letter, please call our office to schedule the follow-up appointment.   If you need a refill on your cardiac medications before your next appointment, please call your pharmacy.   

## 2015-10-02 NOTE — Assessment & Plan Note (Signed)
History of hypertension blood pressure measured 90/70. He is on Bystolic  and lisinopril. Continue current meds at current dosing

## 2015-10-02 NOTE — Progress Notes (Signed)
10/02/2015 [name]   09/26/1940  [id number]  Primary Physician Sherrie Mustache, MD Primary Cardiologist: Lorretta Harp MD Lupe Carney, [place]  HPI:  The patient is a delightful 75 year old fit-appearing married Caucasian male, father of 2 and grandfather of 2 grandchildren, whom I last saw in the office 11/07/12. He has a history of CAD, status post RCA POBA by me back in 1993. 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 Ellouise Newer. He did have moderate disease in his LAD and circumflex as well. His Myoview performed October 21, 2009, was nonischemic. He denies chest pain or shortness of breath. His other problems include hypertension and hyperlipidemia. Since I saw him 3  years ago he has been completely asymptomatic. Dr. Edrick Oh followed his lipid profile which was recently performed 09/27/15 revealed total cholesterol 118, LDL 52 and HDL of 51.   Current Outpatient Prescriptions  Medication Sig Dispense Refill  . aspirin 325 MG tablet Take 325 mg by mouth daily.    . Cetirizine HCl 10 MG CAPS Take 10 mg by mouth daily.    . clopidogrel (PLAVIX) 75 MG tablet Take 75 mg by mouth daily before breakfast.    . glipiZIDE (GLUCOTROL XL) 2.5 MG 24 hr tablet Take 2.5 mg by mouth daily with breakfast.    . lisinopril (PRINIVIL,ZESTRIL) 5 MG tablet Take 5 mg by mouth daily before breakfast.    . Misc Natural Products (COSAMIN ASU ADVANCED FORMULA PO) Take 1 mg by mouth 2 (two) times daily.    . nebivolol (BYSTOLIC) 10 MG tablet Take 10 mg by mouth daily before breakfast.    . omeprazole (PRILOSEC) 20 MG capsule Take 20 mg by mouth daily.    Marland Kitchen OVER THE COUNTER MEDICATION Take 1 capsule by mouth daily. Mega red    . rosuvastatin (CRESTOR) 40 MG tablet Take 1 tablet (40 mg total) by mouth every evening. 30 tablet 4  . zolpidem (AMBIEN) 10 MG tablet Take 10 mg by mouth at bedtime as needed. sleep     No  current facility-administered medications for this visit.     No Known Allergies  Social History   Social History  . Marital status: Married    Spouse name: N/A  . Number of children: N/A  . Years of education: N/A   Occupational History  . Not on file.   Social History Main Topics  . Smoking status: Former Smoker    Quit date: 01/05/2006  . Smokeless tobacco: Never Used  . Alcohol use 2.4 oz/week    2 Cans of beer, 2 Shots of liquor per week  . Drug use: No  . Sexual activity: Not on file   Other Topics Concern  . Not on file   Social History Narrative  . No narrative on file     Review of Systems: General: negative for chills, fever, night sweats or weight changes.  Cardiovascular: negative for chest pain, dyspnea on exertion, edema, orthopnea, palpitations, paroxysmal nocturnal dyspnea or shortness of breath Dermatological: negative for rash Respiratory: negative for cough or wheezing Urologic: negative for hematuria Abdominal: negative for nausea, vomiting, diarrhea, bright red blood per rectum, melena, or hematemesis Neurologic: negative for visual changes, syncope, or dizziness All other systems reviewed and are otherwise negative except as noted above.    Blood pressure 90/70, pulse 74, height 5\' 7"  (1.702 m), weight 168 lb (76.2 kg).  General  appearance: alert and no distress Neck: no adenopathy, no carotid bruit, no JVD, supple, symmetrical, trachea midline and thyroid not enlarged, symmetric, no tenderness/mass/nodules Lungs: clear to auscultation bilaterally Heart: regular rate and rhythm, S1, S2 normal, no murmur, click, rub or gallop Extremities: extremities normal, atraumatic, no cyanosis or edema  EKG normal sinus rhythm at 74 without ST or T-wave changes. There was poor R-wave progression. I personally reviewed this EKG.  ASSESSMENT AND PLAN:   DYSLIPIDEMIA History of dyslipidemia on statin therapy with recent lipid profile profile performed by  his PCP 09/27/15 revealed total cholesterol 118, LDL 52 and HDL of 51  CORONARY ARTERY DISEASE History of CAD status post angioplasty of his RCA by myself back in 1993. He suffered an inferior wall myocardial infarction 03/07/2006 was treated with PCI and stenting using 2 drug-eluting stents by Dr. Ellouise Newer. He did have moderate disease in his LAD and circumflex as well. Myoview performed 10/21/2009 was nonischemic. He denies chest pain or shortness of breath.  Essential hypertension History of hypertension blood pressure measured 90/70. He is on Bystolic  and lisinopril. Continue current meds at current dosing      Lorretta Harp MD [facility], [facility] 10/02/2015 10:29 AM

## 2016-07-23 ENCOUNTER — Encounter: Payer: Self-pay | Admitting: Internal Medicine

## 2016-11-06 ENCOUNTER — Telehealth: Payer: Self-pay | Admitting: Cardiovascular Disease

## 2016-11-06 NOTE — Telephone Encounter (Signed)
Returned call to patient's wife.She stated husband has had 3 episodes in which he becomes disoriented,sweats profusely,slurred speech,tongue numbness.Stated last episode 11/04/16 which lasted appox 45 min.The other episodes in August and Sept lasted appox 5 to 10 min.Stated he is feeling ok at present.Advised he needs to see PCP today.Advised if he has any more episodes call 911.Follow up appointment scheduled with Dr.Berry 11/30/16 at 3:00 pm.

## 2016-11-06 NOTE — Telephone Encounter (Signed)
Bradley Kaufman is calling because Bradley Kaufman has been disoriented, sweating but no Chest Pains. Is asking for an appointment . Please call

## 2016-11-11 ENCOUNTER — Encounter: Payer: Self-pay | Admitting: Internal Medicine

## 2016-11-18 ENCOUNTER — Ambulatory Visit (INDEPENDENT_AMBULATORY_CARE_PROVIDER_SITE_OTHER): Payer: Medicare Other | Admitting: Cardiovascular Disease

## 2016-11-18 ENCOUNTER — Encounter: Payer: Self-pay | Admitting: Cardiovascular Disease

## 2016-11-18 DIAGNOSIS — I10 Essential (primary) hypertension: Secondary | ICD-10-CM

## 2016-11-18 NOTE — Assessment & Plan Note (Signed)
History of essential hypertension blood pressure measured 100/62. He is on lisinopril and Bystolic . Continue current meds at current dosing

## 2016-11-18 NOTE — Assessment & Plan Note (Signed)
History of CAD status post RCA POBA by myself but in 1993. He suffered an inferior wall myocardial infarction 03/07/2006 treated with PCI stenting using 2 drug-eluting stents to Dr. Claiborne Billings. He did have moderate disease in his LAD and circumflex as well. His last Myoview performed 10/21/2009 was nonischemic. He denies chest pain or shortness of breath.

## 2016-11-18 NOTE — Patient Instructions (Signed)

## 2016-11-18 NOTE — Assessment & Plan Note (Signed)
History of dyslipidemia on statin therapy with recent lipid profile performed by his PCP 10/24/16 revealing total cholesterol 110, LDL 47 and HDL of 45.

## 2016-11-18 NOTE — Progress Notes (Signed)
11/18/2016 [name]   1940-04-10  [id number]  Primary Physician Dione Housekeeper, MD Primary Cardiologist: Lorretta Harp MD Gar[facility], [place], [place]  HPI:  Bradley Kaufman is a 76 y.o. male fit-appearing married Caucasian male, father of 2 and grandfather of 2 grandchildren, whom I last saw in the office  10/02/15 . He has a history of CAD, status post RCA POBA by me back in 1993. 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 Ellouise Newer. He did have moderate disease in his LAD and circumflex as well. His Myoview performed October 21, 2009, was nonischemic. He denies chest pain or shortness of breath. His other problems include hypertension and hyperlipidemia. Since I saw him 1  years ago he has been completely asymptomatic. Dr. Edrick Oh followed his lipid profile which was recently performed 10/26/16 revealed total cholesterol 110, LDL 47 and HDL 45.   Current Meds  Medication Sig  . pantoprazole (PROTONIX) 20 MG tablet Take 1 tablet daily by mouth.     No Known Allergies  Social History   Socioeconomic History  . Marital status: Married    Spouse name: Not on file  . Number of children: Not on file  . Years of education: Not on file  . Highest education level: Not on file  Social Needs  . Financial resource strain: Not on file  . Food insecurity - worry: Not on file  . Food insecurity - inability: Not on file  . Transportation needs - medical: Not on file  . Transportation needs - non-medical: Not on file  Occupational History  . Not on file  Tobacco Use  . Smoking status: Former Smoker    Last attempt to quit: 01/05/2006    Years since quitting: 10.8  . Smokeless tobacco: Never Used  Substance and Sexual Activity  . Alcohol use: Yes    Alcohol/week: 2.4 oz    Types: 2 Cans of beer, 2 Shots of liquor per week  . Drug use: No  . Sexual activity: Not on file  Other Topics Concern  . Not on  file  Social History Narrative  . Not on file     Review of Systems: General: negative for chills, fever, night sweats or weight changes.  Cardiovascular: negative for chest pain, dyspnea on exertion, edema, orthopnea, palpitations, paroxysmal nocturnal dyspnea or shortness of breath Dermatological: negative for rash Respiratory: negative for cough or wheezing Urologic: negative for hematuria Abdominal: negative for nausea, vomiting, diarrhea, bright red blood per rectum, melena, or hematemesis Neurologic: negative for visual changes, syncope, or dizziness All other systems reviewed and are otherwise negative except as noted above.    Blood pressure 100/62, pulse 71, height 5\' 7"  (1.702 m), weight 175 lb 12.8 oz (79.7 kg).  General appearance: alert and no distress Neck: no adenopathy, no carotid bruit, no JVD, supple, symmetrical, trachea midline and thyroid not enlarged, symmetric, no tenderness/mass/nodules Lungs: clear to auscultation bilaterally Heart: regular rate and rhythm, S1, S2 normal, no murmur, click, rub or gallop Extremities: extremities normal, atraumatic, no cyanosis or edema Pulses: 2+ and symmetric Skin: Skin color, texture, turgor normal. No rashes or lesions Neurologic: Alert and oriented X 3, normal strength and tone. Normal symmetric reflexes. Normal coordination and gait  EKG sinus rhythm at 71 low limb voltage. I personally reviewed this EKG.  ASSESSMENT AND PLAN:   DYSLIPIDEMIA History of dyslipidemia on statin therapy with recent  lipid profile performed by his PCP 10/24/16 revealing total cholesterol 110, LDL 47 and HDL of 45.  CORONARY ARTERY DISEASE History of CAD status post RCA POBA by myself but in 1993. He suffered an inferior wall myocardial infarction 03/07/2006 treated with PCI stenting using 2 drug-eluting stents to Dr. Claiborne Billings. He did have moderate disease in his LAD and circumflex as well. His last Myoview performed 10/21/2009 was nonischemic.  He denies chest pain or shortness of breath.  Essential hypertension History of essential hypertension blood pressure measured 100/62. He is on lisinopril and Bystolic . Continue current meds at current dosing      Lorretta Harp MD [facility], [facility] 11/18/2016 3:51 PM

## 2016-11-20 ENCOUNTER — Ambulatory Visit: Payer: Medicare Other | Admitting: Cardiovascular Disease

## 2016-12-17 ENCOUNTER — Encounter: Payer: Self-pay | Admitting: Internal Medicine

## 2016-12-17 ENCOUNTER — Ambulatory Visit (INDEPENDENT_AMBULATORY_CARE_PROVIDER_SITE_OTHER): Payer: Medicare Other | Admitting: Internal Medicine

## 2016-12-17 ENCOUNTER — Telehealth: Payer: Self-pay

## 2016-12-17 VITALS — BP 90/60 | HR 66 | Ht 67.0 in | Wt 172.0 lb

## 2016-12-17 DIAGNOSIS — Z1211 Encounter for screening for malignant neoplasm of colon: Secondary | ICD-10-CM | POA: Diagnosis not present

## 2016-12-17 DIAGNOSIS — Z9861 Coronary angioplasty status: Secondary | ICD-10-CM

## 2016-12-17 DIAGNOSIS — I2510 Atherosclerotic heart disease of native coronary artery without angina pectoris: Secondary | ICD-10-CM | POA: Diagnosis not present

## 2016-12-17 DIAGNOSIS — Z7902 Long term (current) use of antithrombotics/antiplatelets: Secondary | ICD-10-CM | POA: Diagnosis not present

## 2016-12-17 NOTE — Telephone Encounter (Signed)
Routing to Preop pool as patient not on anticoagulation per our records. Clearance of plavix/ASA will need to be addressed by APP/MD.

## 2016-12-17 NOTE — Patient Instructions (Signed)
You have been scheduled for a colonoscopy. Please follow written instructions given to you at your visit today.  Please use the Clenpiq we gave you today. If you use inhalers (even only as needed), please bring them with you on the day of your procedure.  You will be contacted by our office prior to your procedure for directions on holding your Plavix.  If you do not hear from our office 1 week prior to your scheduled procedure, please call [phone] to discuss. You can stay on your aspirin.   I appreciate the opportunity to care for you. Silvano Rusk, MD, [facility]

## 2016-12-17 NOTE — Progress Notes (Signed)
Bradley Kaufman 76 y.o. [birth year] [id number]  Assessment & Plan:   Encounter Diagnoses  Name Primary?  . Colon cancer screening Yes  . Antiplatelet or antithrombotic long-term use   . CAD S/P percutaneous coronary angioplasty     Colonoscopy is recommended Clen-piq prep - he wants low volume  .The risks and benefits as well as alternatives of endoscopic procedure(s) have been discussed and reviewed. All questions answered. The patient agrees to proceed. Extra risk of cardiac event/MI off Plavix discussed - anticipate holding Plavix x 5 d before colonoscopy  Will do in 02/2017 - going to [place], [place] 01/2017  I appreciate the opportunity to care for him.  IP:JASNKN, Hollice Espy, MD  Subjective:   Chief Complaint:  HPI The patient is here with his wife regarding colon cancer screening.  He does have a remote history of a diminutive polyp destroyed but in 2008 no polyps so he is average risk screening I think.  He denies any active GI symptoms other than rare difficulty swallowing water which can go months in between, typically with cold water he feels like it transiently stops.  There is no dysphagia to food.  He takes Plavix and aspirin because of a history of coronary artery disease and stenting.  Dr. Alvester Chou follows him.  He says Dr. Alvester Chou states that it was okay for him to stop his Plavix and aspirin up to a week before his colonoscopy.  Recently he had a hypoglycemic episode when he was visiting [place].  That has scared the patient and his wife.  They are tracking his sugars.  So far so good.  Other problems lately are recurrent urinary tract infections x4. No Known Allergies  Current Outpatient Medications:  .  trimethoprim (TRIMPEX) 100 MG tablet, Take 100 mg by mouth daily., Disp: , Rfl:  .  aspirin 325 MG tablet, Take 325 mg by mouth daily., Disp: , Rfl:  .  Cetirizine HCl 10 MG CAPS, Take 10 mg by mouth daily., Disp: , Rfl:  .  clopidogrel (PLAVIX) 75 MG tablet, Take 75 mg  by mouth daily before breakfast., Disp: , Rfl:  .  glipiZIDE (GLUCOTROL XL) 5 MG 24 hr tablet, Take 5 mg daily with breakfast by mouth. , Disp: , Rfl:  .  lisinopril (PRINIVIL,ZESTRIL) 5 MG tablet, Take 5 mg by mouth daily before breakfast., Disp: , Rfl:  .  LORazepam (ATIVAN) 0.5 MG tablet, Take 1 tablet at bedtime by mouth., Disp: , Rfl:  .  Misc Natural Products (COSAMIN ASU ADVANCED FORMULA PO), Take 1 mg by mouth 2 (two) times daily., Disp: , Rfl:  .  nebivolol (BYSTOLIC) 10 MG tablet, Take 10 mg by mouth daily before breakfast., Disp: , Rfl:  .  omeprazole (PRILOSEC) 20 MG capsule, Take 20 mg by mouth daily., Disp: , Rfl:  .  OVER THE COUNTER MEDICATION, Take 1 capsule by mouth daily. Mega red, Disp: , Rfl:  .  pantoprazole (PROTONIX) 20 MG tablet, Take 1 tablet daily by mouth., Disp: , Rfl:  .  rosuvastatin (CRESTOR) 40 MG tablet, Take 1 tablet (40 mg total) by mouth every evening., Disp: 30 tablet, Rfl: 4 .  tamsulosin (FLOMAX) 0.4 MG CAPS capsule, Take 1 capsule daily by mouth., Disp: , Rfl:  .  zolpidem (AMBIEN) 10 MG tablet, Take 10 mg by mouth at bedtime as needed. sleep, Disp: , Rfl:    Past Medical History:  Diagnosis Date  . Anxiety   . Arthritis   .  Coronary artery disease    stents 2007   . Family history of anesthesia complication    mother has pseudocholinesterase   . GERD (gastroesophageal reflux disease)   . Hyperlipidemia   . Hypertension   . Myocardial infarction [facility])    1993 and 2007   . Recurrent UTI   . Skin cancer of face    skin cancer on face    Past Surgical History:  Procedure Laterality Date  . CARDIAC CATHETERIZATION    . CORONARY ANGIOPLASTY     stents 2007   . heel and ankle surgery      right   . KNEE ARTHROSCOPY Left 02/18/2012   Procedure: ARTHROSCOPY KNEE;  Surgeon: Magnus Sinning, MD;  Location: [facility];  Service: Orthopedics;  Laterality: Left;   Social History   Social History Narrative   Retired Systems analyst   Married   1 son 1 daughter   1 caffeine/day   Rare EtoH   12/17/2016   family history includes Aneurysm in his sister; Heart disease in his father; Hyperlipidemia in his mother; Hypertension in his mother; Kidney failure in his father.   Review of Systems 4 UTI's this year Hearing aids Insomnia Anxiety All other ROS negative or per HPI  Objective:   Physical Exam @BP  90/60   Pulse 66   Ht 5\' 7"  (1.702 m)   Wt 172 lb (78 kg)   BMI 26.94 kg/m @  General:  NAD Eyes:   anicteric Lungs:  clear Heart::  S1S2 no rubs, murmurs or gallops Abdomen:  soft and nontender, BS+ Ext:   no edema, cyanosis or clubbing Neuro:  Alert and oriented x 3    Data Reviewed:   As per HPI

## 2016-12-17 NOTE — Telephone Encounter (Signed)
Anti-Coag Clearance letter sent to [facility] for Plavix clearance.  Pt saw Dr Carlean Purl today.

## 2016-12-18 NOTE — Telephone Encounter (Signed)
Okay to interrupt antiplatelet therapy

## 2016-12-21 NOTE — Telephone Encounter (Signed)
Left message for Bradley Kaufman to call me back.

## 2016-12-21 NOTE — Telephone Encounter (Signed)
   Chart reviewed as part of pre-operative protocol coverage. Pre-op clearance question already addressed by colleagues below and GI has contacted pt. Will remove from pre-op pool.  Charlie Pitter, PA-C 12/21/2016, 1:41 PM

## 2016-12-22 ENCOUNTER — Telehealth: Payer: Self-pay

## 2016-12-22 NOTE — Telephone Encounter (Signed)
Patient informed to hold Plavix 5 days and he verbalized understanding. See previous telephone note from 12/17/16.

## 2017-02-11 ENCOUNTER — Other Ambulatory Visit: Payer: Self-pay

## 2017-02-11 ENCOUNTER — Ambulatory Visit (AMBULATORY_SURGERY_CENTER): Payer: Medicare Other | Admitting: Internal Medicine

## 2017-02-11 ENCOUNTER — Encounter: Payer: Self-pay | Admitting: Internal Medicine

## 2017-02-11 VITALS — BP 127/77 | HR 72 | Temp 98.0°F | Resp 12 | Ht 67.0 in | Wt 172.0 lb

## 2017-02-11 DIAGNOSIS — Z86010 Personal history of colonic polyps: Secondary | ICD-10-CM

## 2017-02-11 MED ORDER — SODIUM CHLORIDE 0.9 % IV SOLN: 500.0000 mL | Freq: Once | INTRAVENOUS | Status: DC

## 2017-02-11 NOTE — Op Note (Signed)
Bradley Kaufman: Bradley Kaufman Procedure Date: 02/11/2017 8:37 AM MRN: [id number] Endoscopist: Gatha Mayer , MD Age: 77 Referring MD:  Date of Birth: [birth year] Gender: Male Account #: [id number] Procedure:                Colonoscopy Indications:              High risk colon cancer surveillance: Personal                            history of colonic polyps Medicines:                Propofol per Anesthesia, Monitored Anesthesia Care Procedure:                Pre-Anesthesia Assessment:                           - Prior to the procedure, a History and Physical                            was performed, and patient medications and                            allergies were reviewed. The patient's tolerance of                            previous anesthesia was also reviewed. The risks                            and benefits of the procedure and the sedation                            options and risks were discussed with the patient.                            All questions were answered, and informed consent                            was obtained. Prior Anticoagulants: The patient                            last took Plavix (clopidogrel) 5 days prior to the                            procedure. ASA Grade Assessment: III - A patient                            with severe systemic disease. After reviewing the                            risks and benefits, the patient was deemed in                            satisfactory condition to undergo the procedure.  After obtaining informed consent, the colonoscope                            was passed under direct vision. Throughout the                            procedure, the patient's blood pressure, pulse, and                            oxygen saturations were monitored continuously. The                            Colonoscope was introduced through the anus and                            advanced to the  the cecum, identified by                            appendiceal orifice and ileocecal valve. The                            colonoscopy was performed without difficulty. The                            patient tolerated the procedure well. The quality                            of the bowel preparation was excellent. The bowel                            preparation used was Clen-piq. The ileocecal valve,                            appendiceal orifice, and rectum were photographed. Scope In: 8:43:50 AM Scope Out: 8:55:29 AM Scope Withdrawal Time: 0 hours 9 minutes 35 seconds  Total Procedure Duration: 0 hours 11 minutes 39 seconds  Findings:                 The entire examined colon appeared normal on direct                            and retroflexion views. Complications:            No immediate complications. Estimated Blood Loss:     Estimated blood loss: none. Impression:               - The entire examined colon is normal on direct and                            retroflexion views.                           - No specimens collected. Recommendation:           - Patient has a contact number available for  emergencies. The signs and symptoms of potential                            delayed complications were discussed with the                            patient. Return to normal activities tomorrow.                            Written discharge instructions were provided to the                            patient.                           - Resume previous diet.                           - Continue present medications.                           - Resume Plavix (clopidogrel) at prior dose today.                           - No repeat colonoscopy due to age and the absence                            of colonic polyps. Gatha Mayer, MD 02/11/2017 8:59:55 AM This report has been signed electronically.

## 2017-02-11 NOTE — Progress Notes (Signed)
To PACU, VSS. Report to [email or web address] 

## 2017-02-11 NOTE — Patient Instructions (Addendum)
   Your colon was/is normal! No polyps.  No need for further routine colon cancer screening.  I appreciate the opportunity to care for you. Gatha Mayer, MD, [facility]   Discharge instructions given. Normal exam. Resume previous medications. YOU HAD AN ENDOSCOPIC PROCEDURE TODAY AT [facility]:   Refer to the procedure report that was given to you for any specific questions about what was found during the examination.  If the procedure report does not answer your questions, please call your gastroenterologist to clarify.  If you requested that your care partner not be given the details of your procedure findings, then the procedure report has been included in a sealed envelope for you to review at your convenience later.  YOU SHOULD EXPECT: Some feelings of bloating in the abdomen. Passage of more gas than usual.  Walking can help get rid of the air that was put into your GI tract during the procedure and reduce the bloating. If you had a lower endoscopy (such as a colonoscopy or flexible sigmoidoscopy) you may notice spotting of blood in your stool or on the toilet paper. If you underwent a bowel prep for your procedure, you may not have a normal bowel movement for a few days.  Please Note:  You might notice some irritation and congestion in your nose or some drainage.  This is from the oxygen used during your procedure.  There is no need for concern and it should clear up in a day or so.  SYMPTOMS TO REPORT IMMEDIATELY:   Following lower endoscopy (colonoscopy or flexible sigmoidoscopy):  Excessive amounts of blood in the stool  Significant tenderness or worsening of abdominal pains  Swelling of the abdomen that is new, acute  Fever of 100F or higher   For urgent or emergent issues, a gastroenterologist can be reached at any hour by calling [phone].   DIET:  We do recommend a small meal at first, but then you may proceed to your regular diet.  Drink  plenty of fluids but you should avoid alcoholic beverages for 24 hours.  ACTIVITY:  You should plan to take it easy for the rest of today and you should NOT DRIVE or use heavy machinery until tomorrow (because of the sedation medicines used during the test).    FOLLOW UP: Our staff will call the number listed on your records the next business day following your procedure to check on you and address any questions or concerns that you may have regarding the information given to you following your procedure. If we do not reach you, we will leave a message.  However, if you are feeling well and you are not experiencing any problems, there is no need to return our call.  We will assume that you have returned to your regular daily activities without incident.  If any biopsies were taken you will be contacted by phone or by letter within the next 1-3 weeks.  Please call us at [phone] if you have not heard about the biopsies in 3 weeks.    SIGNATURES/CONFIDENTIALITY: You and/or your care partner have signed paperwork which will be entered into your electronic medical record.  These signatures attest to the fact that that the information above on your After Visit Summary has been reviewed and is understood.  Full responsibility of the confidentiality of this discharge information lies with you and/or your care-partner.

## 2017-02-12 ENCOUNTER — Telehealth: Payer: Self-pay

## 2017-02-12 NOTE — Telephone Encounter (Signed)
  Follow up Call-  Call back number 02/11/2017  Post procedure Call Back phone  # [phone]  Permission to leave phone message Yes  Some recent data might be hidden     Patient questions:  Do you have a fever, pain , or abdominal swelling? No. Pain Score  0 *  Have you tolerated food without any problems? Yes.    Have you been able to return to your normal activities? Yes.    Do you have any questions about your discharge instructions: Diet   No. Medications  No. Follow up visit  No.  Do you have questions or concerns about your Care? No.  Actions: * If pain score is 4 or above: No action needed, pain <4.

## 2018-11-28 ENCOUNTER — Encounter: Payer: Self-pay | Admitting: Cardiovascular Disease

## 2018-11-28 ENCOUNTER — Ambulatory Visit: Payer: Medicare Other | Admitting: Cardiovascular Disease

## 2018-11-28 ENCOUNTER — Other Ambulatory Visit: Payer: Self-pay

## 2018-11-28 VITALS — BP 116/80 | HR 71 | Ht 67.0 in | Wt 175.8 lb

## 2018-11-28 DIAGNOSIS — I10 Essential (primary) hypertension: Secondary | ICD-10-CM

## 2018-11-28 NOTE — Patient Instructions (Signed)
Medication Instructions:  Your physician recommends that you continue on your current medications as directed. Please refer to the Current Medication list given to you today.  If you need a refill on your cardiac medications before your next appointment, please call your pharmacy.   Lab work: NONE  Testing/Procedures: NONE  Follow-Up: At [facility], you and your health needs are our priority.  As part of our continuing mission to provide you with exceptional heart care, we have created designated Provider Care Teams.  These Care Teams include your primary Cardiologist (physician) and Advanced Practice Providers (APPs -  Physician Assistants and Nurse Practitioners) who all work together to provide you with the care you need, when you need it. You may see Dr Berry or one of the following Advanced Practice Providers on your designated Care Team:    Luke Kilroy, PA-C  Callie Goodrich, PA-C  Jesse Cleaver, FNP  Your physician wants you to follow-up in: 1 year. You will receive a reminder letter in the mail two months in advance. If you don't receive a letter, please call our office to schedule the follow-up appointment.      

## 2018-11-28 NOTE — Assessment & Plan Note (Signed)
History of essential hypertension blood pressure measured today 116/80.  He is on Bystolic.

## 2018-11-28 NOTE — Assessment & Plan Note (Signed)
History of hyperlipidemia on statin therapy followed by his PCP 

## 2018-11-28 NOTE — Assessment & Plan Note (Signed)
History of CAD status post RCA angioplasty by myself back in 1993.  He suffered inferior wall microinfarction 3/2/8 treated with PCI in drug-eluting stenting by Dr. Claiborne Billings.  He did have moderate disease in his LAD and circumflex.  Myoview performed 10/21/2009 was nonischemic.  He denies chest pain or shortness of breath.

## 2018-11-28 NOTE — Progress Notes (Signed)
11/28/2018 Bradley Kaufman   1940/05/17  [id number]  Primary Physician Chesley Noon, MD Primary Cardiologist: Lorretta Harp MD Garret Reddish, [place], [place]  HPI:  Bradley Kaufman is a 78 y.o.  fit-appearing married Caucasian male, father of 2 and grandfather of 2 grandchildren, whom I last saw in the office  11/18/2016.He has a history of CAD, status post RCA POBA by me back in 1993. 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 Ellouise Newer. He did have moderate disease in his LAD and circumflex as well. His Myoview performed October 21, 2009, was nonischemic. He denies chest pain or shortness of breath. His other problems include hypertension and hyperlipidemia.   Since I saw him 12 months ago he is remained stable.  He denies chest pain or shortness of breath.  His primary care physician has changed to Dr. Melford Aase in [facility] who follows his lipid profile closely.    Current Meds  Medication Sig  . aspirin 325 MG tablet Take 325 mg by mouth daily.  . clopidogrel (PLAVIX) 75 MG tablet Take 75 mg by mouth daily before breakfast.  . glipiZIDE (GLUCOTROL XL) 5 MG 24 hr tablet Take 5 mg daily with breakfast by mouth.   . Misc Natural Products (COSAMIN ASU ADVANCED FORMULA PO) Take 1 mg by mouth 2 (two) times daily.  . nebivolol (BYSTOLIC) 10 MG tablet Take 10 mg by mouth daily before breakfast.  . OVER THE COUNTER MEDICATION Take 1 capsule by mouth daily. Mega red  . pantoprazole (PROTONIX) 20 MG tablet Take 1 tablet daily by mouth.  . rosuvastatin (CRESTOR) 40 MG tablet Take 1 tablet (40 mg total) by mouth every evening.     No Known Allergies  Social History   Socioeconomic History  . Marital status: Married    Spouse name: Not on file  . Number of children: Not on file  . Years of education: Not on file  . Highest education level: Not on file  Occupational History  . Occupation: retired  Photographer  . Financial resource strain: Not on file  . Food insecurity    Worry: Not on file    Inability: Not on file  . Transportation needs    Medical: Not on file    Non-medical: Not on file  Tobacco Use  . Smoking status: Former Smoker    Quit date: 01/06/2003    Years since quitting: 15.9  . Smokeless tobacco: Never Used  Substance and Sexual Activity  . Alcohol use: Yes    Alcohol/week: 4.0 standard drinks    Types: 2 Cans of beer, 2 Shots of liquor per week  . Drug use: No  . Sexual activity: Not on file  Lifestyle  . Physical activity    Days per week: Not on file    Minutes per session: Not on file  . Stress: Not on file  Relationships  . Social Herbalist on phone: Not on file    Gets together: Not on file    Attends religious service: Not on file    Active member of club or organization: Not on file    Attends meetings of clubs or organizations: Not on file    Relationship status: Not on file  . Intimate partner violence    Fear of current or ex partner: Not on file    Emotionally abused: Not on  file    Physically abused: Not on file    Forced sexual activity: Not on file  Other Topics Concern  . Not on file  Social History Narrative   Retired Insurance underwriter   Married   1 son 1 daughter   1 caffeine/day   Rare EtoH   12/17/2016     Review of Systems: General: negative for chills, fever, night sweats or weight changes.  Cardiovascular: negative for chest pain, dyspnea on exertion, edema, orthopnea, palpitations, paroxysmal nocturnal dyspnea or shortness of breath Dermatological: negative for rash Respiratory: negative for cough or wheezing Urologic: negative for hematuria Abdominal: negative for nausea, vomiting, diarrhea, bright red blood per rectum, melena, or hematemesis Neurologic: negative for visual changes, syncope, or dizziness All other systems reviewed and are otherwise negative except as noted above.     Blood pressure 116/80, pulse 71, height 5\' 7"  (1.702 m), weight 175 lb 12.8 oz (79.7 kg), SpO2 95 %.  General appearance: alert and no distress Neck: no adenopathy, no carotid bruit, no JVD, supple, symmetrical, trachea midline and thyroid not enlarged, symmetric, no tenderness/mass/nodules Lungs: clear to auscultation bilaterally Heart: regular rate and rhythm, S1, S2 normal, no murmur, click, rub or gallop Extremities: extremities normal, atraumatic, no cyanosis or edema Pulses: 2+ and symmetric Skin: Skin color, texture, turgor normal. No rashes or lesions Neurologic: Alert and oriented X 3, normal strength and tone. Normal symmetric reflexes. Normal coordination and gait  EKG sinus rhythm at 71 with low QRS voltage, small inferior Q waves.  I personally reviewed this EKG.  ASSESSMENT AND PLAN:   DYSLIPIDEMIA History of hyperlipidemia on statin therapy followed by his PCP  CORONARY ARTERY DISEASE History of CAD status post RCA angioplasty by myself back in 1993.  He suffered inferior wall microinfarction 3/2/8 treated with PCI in drug-eluting stenting by Dr. Claiborne Billings.  He did have moderate disease in his LAD and circumflex.  Myoview performed 10/21/2009 was nonischemic.  He denies chest pain or shortness of breath.  Essential hypertension History of essential hypertension blood pressure measured today 116/80.  He is on Bystolic.      Lorretta Harp MD FACP,FACC,FAHA, [facility] 11/28/2018 8:23 AM

## 2018-12-20 ENCOUNTER — Other Ambulatory Visit: Payer: Self-pay

## 2018-12-20 ENCOUNTER — Other Ambulatory Visit: Payer: Medicare Other

## 2018-12-20 ENCOUNTER — Ambulatory Visit: Payer: Medicare Other | Attending: Family Medicine

## 2018-12-20 DIAGNOSIS — Z20822 Contact with and (suspected) exposure to covid-19: Secondary | ICD-10-CM

## 2018-12-20 NOTE — Addendum Note (Signed)
Addended by: Marvene Staff on: 12/20/2018 01:19 PM   Modules accepted: Orders

## 2018-12-21 LAB — NOVEL CORONAVIRUS, NAA: SARS-CoV-2, NAA: DETECTED — AB

## 2018-12-22 ENCOUNTER — Telehealth: Payer: Self-pay | Admitting: Unknown Physician Specialty

## 2018-12-22 NOTE — Telephone Encounter (Signed)
Called to discuss with patient about Covid symptoms and the use of bamlanivimab, a monoclonal antibody infusion for those with mild to moderate Covid symptoms and at a high risk of hospitalization.  Pt is qualified for this infusion at the [facility] due to Age > 65   Message left to call back  

## 2019-01-04 ENCOUNTER — Other Ambulatory Visit: Payer: Self-pay

## 2019-01-04 ENCOUNTER — Ambulatory Visit: Payer: Medicare Other | Attending: Internal Medicine

## 2019-01-04 DIAGNOSIS — Z20822 Contact with and (suspected) exposure to covid-19: Secondary | ICD-10-CM

## 2019-01-05 LAB — NOVEL CORONAVIRUS, NAA: SARS-CoV-2, NAA: NOT DETECTED

## 2020-02-06 ENCOUNTER — Other Ambulatory Visit: Payer: Self-pay

## 2020-02-06 ENCOUNTER — Ambulatory Visit: Payer: Medicare Other | Admitting: Cardiovascular Disease

## 2020-02-06 ENCOUNTER — Encounter: Payer: Self-pay | Admitting: Cardiovascular Disease

## 2020-02-06 VITALS — BP 118/70 | HR 69 | Ht 67.0 in | Wt 183.5 lb

## 2020-02-06 DIAGNOSIS — I2510 Atherosclerotic heart disease of native coronary artery without angina pectoris: Secondary | ICD-10-CM

## 2020-02-06 DIAGNOSIS — I10 Essential (primary) hypertension: Secondary | ICD-10-CM

## 2020-02-06 NOTE — Assessment & Plan Note (Signed)
History of dyslipidemia on statin therapy with lipid profile performed 01/09/2020 revealing total cholesterol 107, LDL 41 and HDL 51.

## 2020-02-06 NOTE — Progress Notes (Signed)
She says she got total of    02/06/2020 Bradley Kaufman   03/25/1940  [id number]  Primary Physician Chesley Noon, MD Primary Cardiologist: Lorretta Harp MD Garret Reddish, [place], [place]  HPI:  Bradley Kaufman is a 80 y.o.   fit-appearing married Caucasian male, father of 2 and grandfather of 2 grandchildren, whom I last saw in the office  11/28/2018.He has a history of CAD, status post RCA POBA by me back in 1993. 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 Ellouise Newer. He did have moderate disease in his LAD and circumflex as well. His Myoview performed October 21, 2009, was nonischemic. He denies chest pain or shortness of breath. His other problems include hypertension and hyperlipidemia.   Since I saw him 12 months ago he is remained stable.  He denies chest pain or shortness of breath.  His primary care physician has changed to Dr. Melford Aase in [facility] who follows his lipid profile closely.  This was   No outpatient medications have been marked as taking for the 02/06/20 encounter (Office Visit) with Lorretta Harp, MD.     No Known Allergies  Social History   Socioeconomic History  . Marital status: Married    Spouse name: Not on file  . Number of children: Not on file  . Years of education: Not on file  . Highest education level: Not on file  Occupational History  . Occupation: retired  Tobacco Use  . Smoking status: Former Smoker    Quit date: 01/06/2003    Years since quitting: 17.0  . Smokeless tobacco: Never Used  Vaping Use  . Vaping Use: Never used  Substance and Sexual Activity  . Alcohol use: Yes    Alcohol/week: 4.0 standard drinks    Types: 2 Cans of beer, 2 Shots of liquor per week  . Drug use: No  . Sexual activity: Not on file  Other Topics Concern  . Not on file  Social History Narrative   Retired Insurance underwriter   Married   1 son 1 daughter   1  caffeine/day   Rare [facility]   12/17/2016   Social Determinants of Health   Financial Resource Strain: Not on file  Food Insecurity: Not on file  Transportation Needs: Not on file  Physical Activity: Not on file  Stress: Not on file  Social Connections: Not on file  Intimate Partner Violence: Not on file     Review of Systems: General: negative for chills, fever, night sweats or weight changes.  Cardiovascular: negative for chest pain, dyspnea on exertion, edema, orthopnea, palpitations, paroxysmal nocturnal dyspnea or shortness of breath Dermatological: negative for rash Respiratory: negative for cough or wheezing Urologic: negative for hematuria Abdominal: negative for nausea, vomiting, diarrhea, bright red blood per rectum, melena, or hematemesis Neurologic: negative for visual changes, syncope, or dizziness All other systems reviewed and are otherwise negative except as noted above.    Blood pressure 118/70, pulse 69, height 5\' 7"  (1.702 m), weight 183 lb 8 oz (83.2 kg).  General appearance: alert and no distress Neck: no adenopathy, no carotid bruit, no JVD, supple, symmetrical, trachea midline and thyroid not enlarged, symmetric, no tenderness/mass/nodules Lungs: clear to auscultation bilaterally Heart: regular rate and rhythm, S1, S2 normal, no murmur, click, rub or gallop Extremities: extremities normal, atraumatic, no cyanosis or edema Pulses: 2+ and symmetric Skin: Skin color, texture,  turgor normal. No rashes or lesions Neurologic: Alert and oriented X 3, normal strength and tone. Normal symmetric reflexes. Normal coordination and gait  EKG sinus rhythm at 69 without ST or T wave changes.  I personally reviewed this EKG.  ASSESSMENT AND PLAN:   DYSLIPIDEMIA History of dyslipidemia on statin therapy with lipid profile performed 01/09/2020 revealing total cholesterol 107, LDL 41 and HDL 51.  Coronary atherosclerosis History of CAD status post RCA angioplasty by myself  back in 1993.  He suffered an inferior wall myocardial infarction 03/07/2006 and was treated with PCI and stenting using two drug-eluting stents by Dr. Ellouise Newer.  He had moderate LAD and circumflex flex disease at that time.  Myoview performed 10/21/2009 was nonischemic.  He denies chest pain or shortness of breath.  Essential hypertension History of essential hypertension blood pressure measured today 118/70.  He is on Bystolic.      Lorretta Harp MD FACP,FACC,FAHA, [facility] 02/06/2020 10:19 AM

## 2020-02-06 NOTE — Assessment & Plan Note (Signed)
History of CAD status post RCA angioplasty by myself back in 1993.  He suffered an inferior wall myocardial infarction 03/07/2006 and was treated with PCI and stenting using two drug-eluting stents by Dr. Ellouise Newer.  He had moderate LAD and circumflex flex disease at that time.  Myoview performed 10/21/2009 was nonischemic.  He denies chest pain or shortness of breath.

## 2020-02-06 NOTE — Patient Instructions (Signed)

## 2020-02-06 NOTE — Assessment & Plan Note (Signed)
History of essential hypertension blood pressure measured today 118/70.  He is on Bystolic.

## 2021-02-04 ENCOUNTER — Ambulatory Visit: Payer: Medicare Other | Admitting: Cardiovascular Disease

## 2021-03-06 ENCOUNTER — Other Ambulatory Visit: Payer: Self-pay

## 2021-03-06 ENCOUNTER — Ambulatory Visit: Payer: Medicare Other | Admitting: Cardiovascular Disease

## 2021-03-06 ENCOUNTER — Encounter: Payer: Self-pay | Admitting: Cardiovascular Disease

## 2021-03-06 VITALS — BP 106/64 | HR 64 | Ht 67.0 in | Wt 187.8 lb

## 2021-03-06 DIAGNOSIS — I10 Essential (primary) hypertension: Secondary | ICD-10-CM

## 2021-03-06 DIAGNOSIS — I2510 Atherosclerotic heart disease of native coronary artery without angina pectoris: Secondary | ICD-10-CM

## 2021-03-06 NOTE — Assessment & Plan Note (Signed)
History of CAD status post angioplasty by myself back in 1993.  He had inferior myocardial infarction 3/2/await and underwent PCI and drug-eluting stenting by Dr. Claiborne Billings implanted 2 drug-eluting stents at that time.  He had moderate LAD and circumflex disease as well.  He had a Myoview stress test performed 10/21/2009 for which was nonischemic.  He denies chest pain or shortness of breath. ?

## 2021-03-06 NOTE — Patient Instructions (Signed)

## 2021-03-06 NOTE — Assessment & Plan Note (Signed)
History of dyslipidemia on statin therapy with lipid profile performed by his PCP 01/10/2021 revealing total cholesterol 114, LDL 46 and HDL 55. ?

## 2021-03-06 NOTE — Progress Notes (Signed)
? ? ? ?03/06/2021 ?Bradley Kaufman   ?04-17-1940  ?[id number] ? ?Primary Physician Chesley Noon, MD ?Primary Cardiologist: Lorretta Harp MD Bradley Kaufman, [place] ? ?HPI:  Bradley Kaufman is a 81 y.o.  fit-appearing married Caucasian male, father of 2 and grandfather of 2 grandchildren, whom I last saw in the office 02/06/2020.  He is accompanied by his wife Bradley Kaufman today.  He has a history of CAD, status post RCA POBA by me back in 1993. 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 Ellouise Newer. He did have moderate disease in his LAD and circumflex as well. His Myoview performed October 21, 2009, was nonischemic.His other problems include hypertension and hyperlipidemia.  ?  ?Since I saw him 12 months ago he is remained stable.  He denies chest pain or shortness of breath.  His primary care physician has changed to Dr. Melford Aase in [facility] who follows his lipid profile closely.  This was last checked 01/10/2021 revealing total cholesterol 114, LDL 46 and HDL 55.  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 ?  ? ? ?Current Meds  ?Medication Sig  ? aspirin 325 MG tablet Take 325 mg by mouth daily.  ? clopidogrel (PLAVIX) 75 MG tablet Take 75 mg by mouth daily before breakfast.  ? glipiZIDE (GLUCOTROL XL) 5 MG 24 hr tablet Take 5 mg daily with breakfast by mouth.   ? Misc. Devices MISC Blood pressure  cuff  ? nebivolol (BYSTOLIC) 10 MG tablet Take 10 mg by mouth daily before breakfast.  ? ONETOUCH VERIO test strip 1 each 2 (two) times daily.  ? pantoprazole (PROTONIX) 20 MG tablet Take 1 tablet daily by mouth.  ? rosuvastatin (CRESTOR) 40 MG tablet Take 1 tablet (40 mg total) by mouth every evening.  ?  ? ?No Known Allergies ? ?Social History  ? ?Socioeconomic History  ? Marital status: Married  ?  Spouse name: Not on file  ? Number of children: Not on file  ? Years of education: Not on file  ? Highest education level:  Not on file  ?Occupational History  ? Occupation: retired  ?Tobacco Use  ? Smoking status: Former  ?  Types: Cigarettes  ?  Quit date: 01/06/2003  ?  Years since quitting: 18.1  ? Smokeless tobacco: Never  ?Vaping Use  ? Vaping Use: Never used  ?Substance and Sexual Activity  ? Alcohol use: Yes  ?  Alcohol/week: 4.0 standard drinks  ?  Types: 2 Cans of beer, 2 Shots of liquor per week  ? Drug use: No  ? Sexual activity: Not on file  ?Other Topics Concern  ? Not on file  ?Social History Narrative  ? Retired Insurance underwriter  ? Married  ? 1 son 1 daughter  ? 1 caffeine/day  ? Rare EtoH  ? 12/17/2016  ? ?Social Determinants of Health  ? ?Financial Resource Strain: Not on file  ?Food Insecurity: Not on file  ?Transportation Needs: Not on file  ?Physical Activity: Not on file  ?Stress: Not on file  ?Social Connections: Not on file  ?Intimate Partner Violence: Not on file  ?  ? ?Review of Systems: ?General: negative for chills, fever, night sweats or weight changes.  ?Cardiovascular: negative for chest pain, dyspnea on exertion, edema, orthopnea, palpitations, paroxysmal nocturnal dyspnea or shortness of breath ?Dermatological: negative for rash ?Respiratory: negative for  cough or wheezing ?Urologic: negative for hematuria ?Abdominal: negative for nausea, vomiting, diarrhea, bright red blood per rectum, melena, or hematemesis ?Neurologic: negative for visual changes, syncope, or dizziness ?All other systems reviewed and are otherwise negative except as noted above. ? ? ? ?Blood pressure 106/64, pulse 64, height 5\' 7"  (1.702 m), weight 187 lb 12.8 oz (85.2 kg), SpO2 96 %.  ?General appearance: alert and no distress ?Neck: no adenopathy, no carotid bruit, no JVD, supple, symmetrical, trachea midline, and thyroid not enlarged, symmetric, no tenderness/mass/nodules ?Lungs: clear to auscultation bilaterally ?Heart: regular rate and rhythm, S1, S2 normal, no murmur, click, rub or gallop ?Extremities:  extremities normal, atraumatic, no cyanosis or edema ?Pulses: 2+ and symmetric ?Skin: Skin color, texture, turgor normal. No rashes or lesions ?Neurologic: Grossly normal ? ?EKG normal sinus rhythm at 64 with poor R wave progression.  I personally reviewed this EKG. ? ?ASSESSMENT AND PLAN:  ? ?DYSLIPIDEMIA ?History of dyslipidemia on statin therapy with lipid profile performed by his PCP 01/10/2021 revealing total cholesterol 114, LDL 46 and HDL 55. ? ?Coronary atherosclerosis ?History of CAD status post angioplasty by myself back in 1993.  He had inferior myocardial infarction 3/2/await and underwent PCI and drug-eluting stenting by Dr. Claiborne Billings implanted 2 drug-eluting stents at that time.  He had moderate LAD and circumflex disease as well.  He had a Myoview stress test performed 10/21/2009 for which was nonischemic.  He denies chest pain or shortness of breath. ? ?Essential hypertension ?History of essential hypertension a blood pressure measured today at 106/64.  He is on Bystolic. ? ? ? ? ?Lorretta Harp MD FACP,FACC,FAHA, FSCAI ?03/06/2021 ?3:32 PM ?

## 2021-03-06 NOTE — Assessment & Plan Note (Signed)
History of essential hypertension a blood pressure measured today at 106/64.  He is on Bystolic. ?

## 2021-03-25 ENCOUNTER — Ambulatory Visit: Payer: Medicare Other | Admitting: Cardiovascular Disease

## 2022-10-21 ENCOUNTER — Encounter: Payer: Self-pay | Admitting: Cardiovascular Disease

## 2022-10-21 ENCOUNTER — Ambulatory Visit: Payer: Medicare Other | Attending: Cardiovascular Disease | Admitting: Cardiovascular Disease

## 2022-10-21 VITALS — BP 118/68 | HR 61 | Ht 67.0 in | Wt 173.0 lb

## 2022-10-21 DIAGNOSIS — I2510 Atherosclerotic heart disease of native coronary artery without angina pectoris: Secondary | ICD-10-CM

## 2022-10-21 DIAGNOSIS — I10 Essential (primary) hypertension: Secondary | ICD-10-CM

## 2022-10-21 MED ORDER — PANTOPRAZOLE SODIUM 20 MG PO TBEC: 20.0000 mg | DELAYED_RELEASE_TABLET | Freq: Every day | ORAL | 3 refills | Status: DC

## 2022-10-21 NOTE — Assessment & Plan Note (Signed)
History of dyslipidemia on high-dose statin therapy with lipid profile performed 3 months ago revealing total cholesterol 106, LDL 38 and HDL of 52.

## 2022-10-21 NOTE — Patient Instructions (Signed)

## 2022-10-21 NOTE — Progress Notes (Signed)
10/21/2022 Bradley Kaufman   1940/01/11  [id number]  Primary Physician Eartha Inch, MD Primary Cardiologist: Runell Gess MD FACP, [place], [place], [place]  HPI:  Bradley Kaufman is a 82 y.o.  fit-appearing married Caucasian male, father of 2 and grandfather of 2 grandchildren, whom I last saw in the office 03/06/2021.  He is accompanied by his wife Bradley Kaufman today.  He has a history of CAD, status post RCA POBA by me back in 1993. 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 Daphene Jaeger. He did have moderate disease in his LAD and circumflex as well. His Myoview performed October 21, 2009, was nonischemic.His other problems include hypertension and hyperlipidemia.    Since I saw him 18 months ago he is remained stable.  He denies chest pain or shortness of breath.  His primary care physician has changed to Dr. Cyndia Bent in [facility] who follows his lipid profile closely.  This was last checked 3 months ago revealing total cholesterol 106, LDL of 38 and HDL of 52.  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  He did yard work for 8 hours yesterday with his grandson.   Current Meds  Medication Sig   aspirin 325 MG tablet Take 325 mg by mouth daily.   clopidogrel (PLAVIX) 75 MG tablet Take 75 mg by mouth daily before breakfast.   glipiZIDE (GLUCOTROL XL) 5 MG 24 hr tablet Take 5 mg daily with breakfast by mouth.    Misc Natural Products (COSAMIN ASU ADVANCED FORMULA PO) Take 1 mg by mouth 2 (two) times daily.   Misc. Devices MISC Blood pressure  cuff   nebivolol (BYSTOLIC) 10 MG tablet Take 10 mg by mouth daily before breakfast.   ONETOUCH VERIO test strip 1 each 2 (two) times daily.   polyvinyl alcohol (LIQUIFILM TEARS) 1.4 % ophthalmic solution    rosuvastatin (CRESTOR) 40 MG tablet Take 1 tablet (40 mg total) by mouth every evening.     No Known Allergies  Social History   Socioeconomic  History   Marital status: Married    Spouse name: Not on file   Number of children: Not on file   Years of education: Not on file   Highest education level: Not on file  Occupational History   Occupation: retired  Tobacco Use   Smoking status: Former    Current packs/day: 0.00    Types: Cigarettes    Quit date: 01/06/2003    Years since quitting: 19.8   Smokeless tobacco: Never  Vaping Use   Vaping status: Never Used  Substance and Sexual Activity   Alcohol use: Yes    Alcohol/week: 4.0 standard drinks of alcohol    Types: 2 Cans of beer, 2 Shots of liquor per week   Drug use: No   Sexual activity: Not on file  Other Topics Concern   Not on file  Social History Narrative   Retired Proofreader   Married   1 son 1 daughter   1 caffeine/day   Rare EtoH   12/17/2016   Social Determinants of Health   Financial Resource Strain: Low Risk  (03/03/2022)   Received from [facility]   Overall Financial Resource Strain (CARDIA)    Difficulty of Paying Living Expenses: Not hard at all  Food Insecurity: No Food Insecurity (03/03/2022)   Received from [facility]  Hunger Vital Sign    Worried About Running Out of Food in the Last Year: Never true    Ran Out of Food in the Last Year: Never true  Transportation Needs: No Transportation Needs (03/03/2022)   Received from [facility] - Transportation    Lack of Transportation (Medical): No    Lack of Transportation (Non-Medical): No  Physical Activity: Sufficiently Active (01/15/2022)   Received from [facility]   Exercise Vital Sign    Days of Exercise per Week: 4 days    Minutes of Exercise per Session: 150+ min  Stress: No Stress Concern Present (01/15/2022)   Received from [facility] - Occupational Stress Questionnaire    Feeling of Stress : Not at all  Social Connections: Moderately Integrated (01/15/2022)   Received from [facility]    Social Network    How would you rate your social network (family, work, friends)?: Adequate participation with social networks  Intimate Partner Violence: Not At Risk (01/15/2022)   Received from [facility]   HITS    Over the last 12 months how often did your partner physically hurt you?: 1    Over the last 12 months how often did your partner insult you or talk down to you?: 1    Over the last 12 months how often did your partner threaten you with physical harm?: 1    Over the last 12 months how often did your partner scream or curse at you?: 1     Review of Systems: General: negative for chills, fever, night sweats or weight changes.  Cardiovascular: negative for chest pain, dyspnea on exertion, edema, orthopnea, palpitations, paroxysmal nocturnal dyspnea or shortness of breath Dermatological: negative for rash Respiratory: negative for cough or wheezing Urologic: negative for hematuria Abdominal: negative for nausea, vomiting, diarrhea, bright red blood per rectum, melena, or hematemesis Neurologic: negative for visual changes, syncope, or dizziness All other systems reviewed and are otherwise negative except as noted above.    Blood pressure 118/68, pulse 61, height 5\' 7"  (1.702 m), weight 173 lb (78.5 kg), SpO2 98%.  General appearance: alert and no distress Neck: no adenopathy, no carotid bruit, no JVD, supple, symmetrical, trachea midline, and thyroid not enlarged, symmetric, no tenderness/mass/nodules Lungs: clear to auscultation bilaterally Heart: regular rate and rhythm, S1, S2 normal, no murmur, click, rub or gallop Extremities: extremities normal, atraumatic, no cyanosis or edema Pulses: 2+ and symmetric Skin: Skin color, texture, turgor normal. No rashes or lesions Neurologic: Grossly normal  EKG EKG Interpretation Date/Time:  Wednesday October 21 2022 11:23:05 EDT Ventricular Rate:  61 PR Interval:  162 QRS Duration:  88 QT Interval:  422 QTC  Calculation: 424 R Axis:   5  Text Interpretation: Normal sinus rhythm Normal ECG When compared with ECG of 08-Mar-2006 07:15, No significant change was found Confirmed by Nanetta Batty [phone]) on 10/21/2022 11:26:11 AM    ASSESSMENT AND PLAN:   DYSLIPIDEMIA History of dyslipidemia on high-dose statin therapy with lipid profile performed 3 months ago revealing total cholesterol 106, LDL 38 and HDL of 52.  Coronary atherosclerosis History of CAD status post arthroplasty by myself back in 1993.  He suffered an inferior myocardial infarction 3/2/2 with PCI and drug-eluting stenting by Dr. Tresa Endo.  Did have moderate disease in the LAD and circumflex as well.  Myoview performed 10/21/2009 was nonischemic.  He is active and asymptomatic.  Essential hypertension History of essential hypertension blood pressure  measured today at 118/68.  He is on Bystolic.Runell Gess MD FACP,FACC,FAHA, [facility] 10/21/2022 11:37 AM

## 2022-10-21 NOTE — Assessment & Plan Note (Signed)
History of CAD status post arthroplasty by myself back in 1993.  He suffered an inferior myocardial infarction 3/2/2 with PCI and drug-eluting stenting by Dr. Tresa Endo.  Did have moderate disease in the LAD and circumflex as well.  Myoview performed 10/21/2009 was nonischemic.  He is active and asymptomatic.

## 2022-10-21 NOTE — Assessment & Plan Note (Signed)
History of essential hypertension blood pressure measured today at 118/68.  He is on Bystolic.Bradley Kaufman

## 2023-06-23 ENCOUNTER — Telehealth: Payer: Self-pay | Admitting: *Deleted

## 2023-06-23 NOTE — Telephone Encounter (Signed)
   Pre-operative Risk Assessment    Patient Name: Bradley Kaufman  DOB: 1940/10/21 MRN: [id number]   Date of last office visit: 10/21/22 DR. BERRY Date of next office visit: NONE   Request for Surgical Clearance    Procedure:  TFESI  Date of Surgery:  Clearance TBD                                Surgeon:  DR. Galvin Jules Surgeon's Group or Practice Name:  [facility] Phone number:  [phone] Fax number:  [phone]   Type of Clearance Requested:   - Medical  - Pharmacy:  Hold Clopidogrel (Plavix) x 7 DAYS PRIOR   Type of Anesthesia:  Local    Additional requests/questions:    Signed, Jem Castro   06/23/2023, 5:27 PM

## 2023-06-24 ENCOUNTER — Telehealth: Payer: Self-pay

## 2023-06-24 NOTE — Telephone Encounter (Signed)
 Tried calling patient to schedule tele preop appt no answer left a detailed vm to call back and schedule

## 2023-06-24 NOTE — Telephone Encounter (Signed)
   Name: Bradley Kaufman  DOB: [birth year]  MRN: [id number]  Primary Cardiologist: None   Preoperative team, please contact this patient and set up a phone call appointment for further preoperative risk assessment. Please obtain consent and complete medication review. Thank you for your help.  I confirm that guidance regarding antiplatelet and oral anticoagulation therapy has been completed and, if necessary, noted below.  Patient's aspirin and Plavix may be held for 5 to 7 days prior to his procedure.  Please resume as soon as hemostasis is achieved.  I also confirmed the patient resides in the state of [place] . As per [facility] Medical Board telemedicine laws, the patient must reside in the state in which the provider is licensed.   Carie Charity, NP 06/24/2023, 12:46 PM [facility]

## 2023-06-24 NOTE — Telephone Encounter (Signed)
 Patient has been scheduled for tele visit and consent done     Patient Consent for Virtual Visit         Bradley Kaufman has provided verbal consent on 06/24/2023 for a virtual visit (video or telephone).   CONSENT FOR VIRTUAL VISIT FOR:  Bradley Kaufman  By participating in this virtual visit I agree to the following:  I hereby voluntarily request, consent and authorize [facility] and its employed or contracted physicians, physician assistants, nurse practitioners or other licensed health care professionals (the Practitioner), to provide me with telemedicine health care services (the "Services) as deemed necessary by the treating Practitioner. I acknowledge and consent to receive the Services by the Practitioner via telemedicine. I understand that the telemedicine visit will involve communicating with the Practitioner through live audiovisual communication technology and the disclosure of certain medical information by electronic transmission. I acknowledge that I have been given the opportunity to request an in-person assessment or other available alternative prior to the telemedicine visit and am voluntarily participating in the telemedicine visit.  I understand that I have the right to withhold or withdraw my consent to the use of telemedicine in the course of my care at any time, without affecting my right to future care or treatment, and that the Practitioner or I may terminate the telemedicine visit at any time. I understand that I have the right to inspect all information obtained and/or recorded in the course of the telemedicine visit and may receive copies of available information for a reasonable fee.  I understand that some of the potential risks of receiving the Services via telemedicine include:  Delay or interruption in medical evaluation due to technological equipment failure or disruption; Information transmitted may not be sufficient (e.g. poor resolution of images) to  allow for appropriate medical decision making by the Practitioner; and/or  In rare instances, security protocols could fail, causing a breach of personal health information.  Furthermore, I acknowledge that it is my responsibility to provide information about my medical history, conditions and care that is complete and accurate to the best of my ability. I acknowledge that Practitioner's advice, recommendations, and/or decision may be based on factors not within their control, such as incomplete or inaccurate data provided by me or distortions of diagnostic images or specimens that may result from electronic transmissions. I understand that the practice of medicine is not an exact science and that Practitioner makes no warranties or guarantees regarding treatment outcomes. I acknowledge that a copy of this consent can be made available to me via my patient portal [facility] MyChart), or I can request a printed copy by calling the office of [facility].    I understand that my insurance will be billed for this visit.   I have read or had this consent read to me. I understand the contents of this consent, which adequately explains the benefits and risks of the Services being provided via telemedicine.  I have been provided ample opportunity to ask questions regarding this consent and the Services and have had my questions answered to my satisfaction. I give my informed consent for the services to be provided through the use of telemedicine in my medical care

## 2023-06-24 NOTE — Telephone Encounter (Signed)
 Patient has been scheduled for tele preop appt

## 2023-06-28 ENCOUNTER — Ambulatory Visit: Attending: Internal Medicine

## 2023-06-28 DIAGNOSIS — Z01810 Encounter for preprocedural cardiovascular examination: Secondary | ICD-10-CM | POA: Diagnosis not present

## 2023-06-28 NOTE — Progress Notes (Signed)
 Virtual Visit via Telephone Note   Because of Bradley Kaufman co-morbid illnesses, he is at least at moderate risk for complications without adequate follow up.  This format is felt to be most appropriate for this patient at this time.  Due to technical limitations with video connection (technology), today's appointment will be conducted as an audio only telehealth visit, and Bradley Kaufman verbally agreed to proceed in this manner.   All issues noted in this document were discussed and addressed.  No physical exam could be performed with this format.  Evaluation Performed:  Preoperative cardiovascular risk assessment _____________   Date:  06/28/2023   Patient ID:  Bradley Kaufman, DOB 28-Mar-1940, MRN [id number] Patient Location:  Home Provider location:   Office  Primary Care Provider:  Sophronia Ozell BROCKS, MD Primary Cardiologist:  None  Chief Complaint / Patient Profile   83 y.o. y/o male with a h/o coronary artery disease, hyperlipidemia, hypertension who is pending TFESI and presents today for telephonic preoperative cardiovascular risk assessment.  History of Present Illness    Bradley Kaufman is a 83 y.o. male who presents via audio/video conferencing for a telehealth visit today.  Pt was last seen in cardiology clinic on 10/21/2022 by Dr. Court.  At that time Bradley Kaufman was doing well .  The patient is now pending procedure as outlined above. Since his last visit, he continues to be stable from a cardiac standpoint.  Today he denies chest pain, shortness of breath, lower extremity edema, fatigue, palpitations, melena, hematuria, hemoptysis, diaphoresis, weakness, presyncope, syncope, orthopnea, and PND.   Past Medical History    Past Medical History:  Diagnosis Date   Anxiety    Arthritis    Coronary artery disease    stents 2007    Family history of anesthesia complication    mother has pseudocholinesterase    GERD (gastroesophageal reflux disease)    Hyperlipidemia     Hypertension    Myocardial infarction [facility])    1993 and 2007    Recurrent UTI    Skin cancer of face    skin cancer on face    Past Surgical History:  Procedure Laterality Date   CARDIAC CATHETERIZATION     COLONOSCOPY     CORONARY ANGIOPLASTY     stents 2007    heel and ankle surgery      right    KNEE ARTHROSCOPY Left 02/18/2012   Procedure: ARTHROSCOPY KNEE;  Surgeon: Bradley SHAUNNA Bern, MD;  Location: [facility];  Service: Orthopedics;  Laterality: Left;    Allergies  No Known Allergies  Home Medications    Prior to Admission medications   Medication Sig Start Date End Date Taking? Authorizing Provider  aspirin 325 MG tablet Take 325 mg by mouth daily.    [provider]  clopidogrel (PLAVIX) 75 MG tablet Take 75 mg by mouth daily before breakfast.    [provider]  glipiZIDE (GLUCOTROL XL) 5 MG 24 hr tablet Take 5 mg daily with breakfast by mouth.     [provider]  Misc Natural Products (COSAMIN ASU ADVANCED FORMULA PO) Take 1 mg by mouth 2 (two) times daily.    [provider]  Misc. Devices MISC Blood pressure  cuff 05/09/18   [provider]  nebivolol (BYSTOLIC) 10 MG tablet Take 10 mg by mouth daily before breakfast.    [provider]  [facility] VERIO test strip 1 each 2 (two) times daily. 01/23/21  [provider]  OVER THE COUNTER MEDICATION Take 1 capsule by mouth daily. Mega red Patient not taking: Reported on 10/21/2022    [provider]  pantoprazole  (PROTONIX ) 20 MG tablet Take 1 tablet (20 mg total) by mouth daily. 10/21/22 12/13/27  Bradley Kaufman Dorn PARAS, MD  polyvinyl alcohol (LIQUIFILM TEARS) 1.4 % ophthalmic solution     [provider]  rosuvastatin  (CRESTOR ) 40 MG tablet Take 1 tablet (40 mg total) by mouth every evening. 07/25/12   Bradley Kaufman Dorn PARAS, MD    Physical Exam    Vital Signs:  Bradley Kaufman does not have vital signs available for review today.  Given telephonic  nature of communication, physical exam is limited. AAOx3. NAD. Normal affect.  Speech and respirations are unlabored.  Accessory Clinical Findings    None  Assessment & Plan    1.  Preoperative Cardiovascular Risk Assessment:Procedure:  TFESI   Date of Surgery:  Clearance TBD                                  Surgeon:  Bradley Kaufman Surgeon's Group or Practice Name:  [facility] Phone number:  [phone] Fax number:  [phone]        Primary Cardiologist: Bradley Kaufman  Chart reviewed as part of pre-operative protocol coverage. Given past medical history and time since last visit, based on ACC/AHA guidelines, Bradley Kaufman would be at acceptable risk for the planned procedure without further cardiovascular testing.   His RCRI is moderate risk, 6.6% risk of major cardiac event.  He is able to complete greater than 4 METS of physical activity.  Patient was advised that if he develops new symptoms prior to surgery to contact our office to arrange a follow-up appointment.  He verbalized understanding.  Patient's aspirin and Plavix may be held for 5 to 7 days prior to his procedure. Please resume as soon as hemostasis is achieved.   I will route this recommendation to the requesting party via Epic fax function and remove from pre-op pool.       Time:   Today, I have spent 5 minutes with the patient with telehealth technology discussing medical history, symptoms, and management plan. I spent 10 minutes reviewing patient's past cardiac history and cardiac medications.     Bradley CHRISTELLA Beauvais, NP  06/28/2023, 6:57 AM

## 2023-07-06 ENCOUNTER — Telehealth: Payer: Self-pay | Admitting: *Deleted

## 2023-07-06 NOTE — Telephone Encounter (Signed)
   Pre-operative Risk Assessment    Patient Name: Bradley Kaufman  DOB: 1940/09/26 MRN: [id number]   Date of last office visit: 06/28/23 TELEVISIT PREOP; 10/21/22 DR. BERRY Date of next office visit: NONE   Request for Surgical Clearance    Procedure:  T9-T10 KYPHOPLASTY  Date of Surgery:  Clearance TBD                                Surgeon:  DR. ROCKEY PAE Surgeon's Group or Practice Name:  [facility] number:  [phone] Fax number:  [phone]   Type of Clearance Requested:   - Medical  - Pharmacy:  Hold Clopidogrel (Plavix) x 5-7 DAYS PRIOR   Type of Anesthesia:  MAC   Additional requests/questions:    Bonney Niels Jest   07/06/2023, 3:14 PM

## 2023-10-18 ENCOUNTER — Ambulatory Visit: Attending: Cardiovascular Disease | Admitting: Cardiovascular Disease

## 2023-10-18 ENCOUNTER — Encounter: Payer: Self-pay | Admitting: Cardiovascular Disease

## 2023-10-18 VITALS — BP 112/76 | HR 67 | Ht 66.5 in | Wt 161.8 lb

## 2023-10-18 DIAGNOSIS — I10 Essential (primary) hypertension: Secondary | ICD-10-CM

## 2023-10-18 DIAGNOSIS — I2510 Atherosclerotic heart disease of native coronary artery without angina pectoris: Secondary | ICD-10-CM | POA: Diagnosis not present

## 2023-10-18 NOTE — Patient Instructions (Signed)

## 2023-10-18 NOTE — Assessment & Plan Note (Signed)
 History of dyslipidemia on high-dose rosuvastatin  with lipid profile performed by his PCP 01/22/2023 revealing total cholesterol 119, LDL 48 and HDL 55.

## 2023-10-18 NOTE — Assessment & Plan Note (Signed)
 History of essential hypertension blood pressure measured today at 112/76.  He is on Bystolic.

## 2023-10-18 NOTE — Progress Notes (Signed)
 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  fit-appearing married Caucasian male, father of 2 and grandfather of 2 grandchildren, whom I last saw in the office 10/21/2022.  He is accompanied by his wife Bradley Kaufman today.  He has a history of CAD, status post RCA POBA by me back in 1993. 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 Charlena Sor. He did have moderate disease in his LAD and circumflex as well. His Myoview performed October 21, 2009, was nonischemic.His other problems include hypertension and hyperlipidemia.    Since I saw him a year ago he remained stable.  He has had 2 back surgeries as well as an episode of cystitis.  He remains fairly active and is completely asymptomatic.  His most recent lipid profile performed by his PCP 01/22/2023 revealed total cholesterol 119, LDL 48 and HDL 55.   Current Meds  Medication Sig   glipiZIDE (GLUCOTROL XL) 5 MG 24 hr tablet Take 5 mg daily with breakfast by mouth.    Misc Natural Products (COSAMIN ASU ADVANCED FORMULA PO) Take 1 mg by mouth 2 (two) times daily.   Misc. Devices MISC Blood pressure  cuff   nebivolol (BYSTOLIC) 10 MG tablet Take 10 mg by mouth daily before breakfast.   ONETOUCH VERIO test strip 1 each 2 (two) times daily.   pantoprazole  (PROTONIX ) 20 MG tablet Take 1 tablet (20 mg total) by mouth daily.   polyvinyl alcohol (LIQUIFILM TEARS) 1.4 % ophthalmic solution    rosuvastatin  (CRESTOR ) 40 MG tablet Take 1 tablet (40 mg total) by mouth every evening.     No Known Allergies  Social History   Socioeconomic History   Marital status: Married    Spouse name: Not on file   Number of children: Not on file   Years of education: Not on file   Highest education level: Not on file  Occupational  History   Occupation: retired  Tobacco Use   Smoking status: Former    Current packs/day: 0.00    Types: Cigarettes    Quit date: 01/06/2003    Years since quitting: 20.7   Smokeless tobacco: Never  Vaping Use   Vaping status: Never Used  Substance and Sexual Activity   Alcohol use: Yes    Alcohol/week: 4.0 standard drinks of alcohol    Types: 2 Cans of beer, 2 Shots of liquor per week   Drug use: No   Sexual activity: Not on file  Other Topics Concern   Not on file  Social History Narrative   Retired Proofreader   Married   1 son 1 daughter   1 caffeine/day   Rare EtoH   12/17/2016   Social Drivers of Health   Financial Resource Strain: Medium Risk (06/16/2023)   Received from [facility]   Overall Financial Resource Strain (CARDIA)    Difficulty of Paying Living Expenses: Somewhat hard  Food Insecurity: No Food Insecurity (08/29/2023)   Received from [facility]   Hunger Vital Sign    Within the past 12 months, you worried that your food would run out before you got the money to buy more.: Never true    Within the past 12 months, the food you bought just  didn't last and you didn't have money to get more.: Never true  Transportation Needs: No Transportation Needs (08/29/2023)   Received from [facility] - Transportation    In the past 12 months, has lack of transportation kept you from medical appointments or from getting medications?: No    In the past 12 months, has lack of transportation kept you from meetings, work, or from getting things needed for daily living?: No  Physical Activity: Unknown (06/16/2023)   Received from [facility]   Exercise Vital Sign    On average, how many days per week do you engage in moderate to strenuous exercise (like a brisk walk)?: 0 days    Minutes of Exercise per Session: Not on file  Stress: No Stress Concern Present (08/29/2023)   Received from [facility] - Occupational Stress Questionnaire    Do you feel stress - tense, restless, nervous, or anxious, or unable to sleep at night because your mind is troubled all the time - these days?: Only a little  Social Connections: Socially Integrated (06/16/2023)   Received from [facility]   Social Network    How would you rate your social network (family, work, friends)?: Good participation with social networks  Intimate Partner Violence: Not At Risk (08/27/2023)   Received from [facility]   HITS    Over the last 12 months how often did your partner physically hurt you?: Never    Over the last 12 months how often did your partner insult you or talk down to you?: Never    Over the last 12 months how often did your partner threaten you with physical harm?: Never    Over the last 12 months how often did your partner scream or curse at you?: Never     Review of Systems: General: negative for chills, fever, night sweats or weight changes.  Cardiovascular: negative for chest pain, dyspnea on exertion, edema, orthopnea, palpitations, paroxysmal nocturnal dyspnea or shortness of breath Dermatological: negative for rash Respiratory: negative for cough or wheezing Urologic: negative for hematuria Abdominal: negative for nausea, vomiting, diarrhea, bright red blood per rectum, melena, or hematemesis Neurologic: negative for visual changes, syncope, or dizziness All other systems reviewed and are otherwise negative except as noted above.    Blood pressure 112/76, pulse 67, height 5' 6.5 (1.689 m), weight 161 lb 12.8 oz (73.4 kg), SpO2 94%.  General appearance: alert and no distress Neck: no adenopathy, no carotid bruit, no JVD, supple, symmetrical, trachea midline, and thyroid not enlarged, symmetric, no tenderness/mass/nodules Lungs: clear to auscultation bilaterally Heart: regular rate and rhythm, S1, S2 normal, no murmur, click, rub or gallop Extremities: extremities normal,  atraumatic, no cyanosis or edema Pulses: 2+ and symmetric Skin: Skin color, texture, turgor normal. No rashes or lesions Neurologic: Grossly normal  EKG EKG Interpretation Date/Time:  Monday October 18 2023 11:33:45 EDT Ventricular Rate:  67 PR Interval:  150 QRS Duration:  80 QT Interval:  402 QTC Calculation: 424 R Axis:   -72  Text Interpretation: Normal sinus rhythm Left axis deviation Low voltage QRS Inferior infarct , age undetermined When compared with ECG of 21-Oct-2022 11:23, QRS axis Shifted left Inferior infarct is now Present Confirmed by Court Carrier [phone]) on 10/18/2023 11:58:07 AM    ASSESSMENT AND PLAN:   DYSLIPIDEMIA History of dyslipidemia on high-dose rosuvastatin  with lipid profile performed by his PCP 01/22/2023 revealing total cholesterol 119, LDL 48 and HDL  55.  Coronary atherosclerosis History of CAD status post RCA angioplasty by myself back in 1993.  He suffered inferior microinfarction 03/07/2006 and was treated with stenting using 2 drug-eluting stents by Dr. Charlena Sor.  He did have moderate disease in his LAD and circumflex as well.  Myoview performed 10/21/2009 and 08/24/2014 were nonischemic.  He is completely asymptomatic.  Essential hypertension History of essential hypertension blood pressure measured today at 112/76.  He is on Bystolic.     Bradley DOROTHA Lesches MD FACP,FACC,FAHA, [facility] 10/18/2023 12:09 PM

## 2023-10-18 NOTE — Assessment & Plan Note (Addendum)
 History of CAD status post RCA angioplasty by myself back in 1993.  He suffered inferior microinfarction 03/07/2006 and was treated with stenting using 2 drug-eluting stents by Dr. Charlena Sor.  He did have moderate disease in his LAD and circumflex as well.  Myoview performed 10/21/2009 and 08/24/2014 were nonischemic.  He is completely asymptomatic.

## 2024-01-21 ENCOUNTER — Other Ambulatory Visit: Payer: Self-pay | Admitting: Cardiovascular Disease
